# Patient Record
Sex: Female | Born: 1945
Health system: Southern US, Community
[De-identification: ages and names within clinical notes are randomized; demographics above are authoritative.]

## PROBLEM LIST (undated history)

## (undated) DIAGNOSIS — I10 Essential (primary) hypertension: Secondary | ICD-10-CM

## (undated) DIAGNOSIS — T7840XA Allergy, unspecified, initial encounter: Secondary | ICD-10-CM

## (undated) DIAGNOSIS — I82409 Acute embolism and thrombosis of unspecified deep veins of unspecified lower extremity: Secondary | ICD-10-CM

## (undated) HISTORY — DX: Acute embolism and thrombosis of unspecified deep veins of unspecified lower extremity: I82.409

## (undated) HISTORY — PX: BREAST CYST ASPIRATION: SHX578

## (undated) HISTORY — DX: Allergy, unspecified, initial encounter: T78.40XA

---

## 2001-01-19 ENCOUNTER — Encounter (INDEPENDENT_AMBULATORY_CARE_PROVIDER_SITE_OTHER): Payer: Self-pay | Admitting: Specialist

## 2001-01-19 ENCOUNTER — Ambulatory Visit (HOSPITAL_COMMUNITY): Admission: RE | Admit: 2001-01-19 | Discharge: 2001-01-19 | Payer: Self-pay | Admitting: Gastroenterology

## 2001-02-07 ENCOUNTER — Other Ambulatory Visit: Admission: RE | Admit: 2001-02-07 | Discharge: 2001-02-07 | Payer: Self-pay | Admitting: Obstetrics and Gynecology

## 2001-08-08 ENCOUNTER — Other Ambulatory Visit: Admission: RE | Admit: 2001-08-08 | Discharge: 2001-08-08 | Payer: Self-pay | Admitting: Obstetrics and Gynecology

## 2002-03-28 ENCOUNTER — Other Ambulatory Visit: Admission: RE | Admit: 2002-03-28 | Discharge: 2002-03-28 | Payer: Self-pay | Admitting: Family Medicine

## 2002-04-04 ENCOUNTER — Encounter: Admission: RE | Admit: 2002-04-04 | Discharge: 2002-04-04 | Payer: Self-pay | Admitting: Family Medicine

## 2002-04-04 ENCOUNTER — Encounter: Payer: Self-pay | Admitting: Family Medicine

## 2002-04-06 ENCOUNTER — Encounter: Admission: RE | Admit: 2002-04-06 | Discharge: 2002-04-06 | Payer: Self-pay | Admitting: Family Medicine

## 2002-04-06 ENCOUNTER — Encounter: Payer: Self-pay | Admitting: Family Medicine

## 2002-09-15 ENCOUNTER — Other Ambulatory Visit: Admission: RE | Admit: 2002-09-15 | Discharge: 2002-09-15 | Payer: Self-pay | Admitting: Obstetrics and Gynecology

## 2003-07-31 ENCOUNTER — Encounter: Admission: RE | Admit: 2003-07-31 | Discharge: 2003-07-31 | Payer: Self-pay | Admitting: Family Medicine

## 2003-10-02 ENCOUNTER — Other Ambulatory Visit: Admission: RE | Admit: 2003-10-02 | Discharge: 2003-10-02 | Payer: Self-pay | Admitting: Obstetrics and Gynecology

## 2004-08-04 ENCOUNTER — Encounter: Admission: RE | Admit: 2004-08-04 | Discharge: 2004-08-04 | Payer: Self-pay | Admitting: Family Medicine

## 2004-10-22 ENCOUNTER — Other Ambulatory Visit: Admission: RE | Admit: 2004-10-22 | Discharge: 2004-10-22 | Payer: Self-pay | Admitting: Obstetrics and Gynecology

## 2004-11-10 ENCOUNTER — Encounter: Admission: RE | Admit: 2004-11-10 | Discharge: 2004-11-10 | Payer: Self-pay | Admitting: Family Medicine

## 2005-10-30 ENCOUNTER — Other Ambulatory Visit: Admission: RE | Admit: 2005-10-30 | Discharge: 2005-10-30 | Payer: Self-pay | Admitting: Obstetrics and Gynecology

## 2005-11-11 ENCOUNTER — Encounter: Admission: RE | Admit: 2005-11-11 | Discharge: 2005-11-11 | Payer: Self-pay | Admitting: Family Medicine

## 2006-12-15 ENCOUNTER — Encounter: Admission: RE | Admit: 2006-12-15 | Discharge: 2006-12-15 | Payer: Self-pay | Admitting: Family Medicine

## 2007-12-16 ENCOUNTER — Encounter: Admission: RE | Admit: 2007-12-16 | Discharge: 2007-12-16 | Payer: Self-pay | Admitting: Family Medicine

## 2008-12-18 ENCOUNTER — Encounter: Admission: RE | Admit: 2008-12-18 | Discharge: 2008-12-18 | Payer: Self-pay | Admitting: Family Medicine

## 2009-03-19 ENCOUNTER — Encounter: Admission: RE | Admit: 2009-03-19 | Discharge: 2009-03-19 | Payer: Self-pay | Admitting: Family Medicine

## 2009-12-24 ENCOUNTER — Encounter: Admission: RE | Admit: 2009-12-24 | Discharge: 2009-12-24 | Payer: Self-pay | Admitting: Family Medicine

## 2010-02-25 ENCOUNTER — Encounter: Admission: RE | Admit: 2010-02-25 | Discharge: 2010-02-25 | Payer: Self-pay | Admitting: Family Medicine

## 2010-10-03 ENCOUNTER — Other Ambulatory Visit: Payer: Self-pay | Admitting: Gastroenterology

## 2010-10-03 NOTE — Procedures (Signed)
[place]  Patient:    Angie Bruce, Angie Bruce Visit Number: [id number] MRN: [id number]          Service Type: END Location: ENDO Attending Physician:  Nelda Marseille Admit Date:  01/19/2001   CC:         Geraldo Pitter, M.D.   Procedure Report  NAME OF PROCEDURE:  Colonoscopy with biopsy.  ENDOSCOPIST:  Petra Kuba, M.D.  INDICATIONS:  Family history of colon cancer.  CONSENT:  Consent was signed after risks, benefits, methods and options were thoroughly discussed in the office.  MEDICINES USED:  Demerol 50 and Versed 5.  PROCEDURE:  Rectal inspection was pertinent for small external hemorrhoids. Digital exam was negative.  The pediatric video colonoscope was inserted, easily advanced around the colon to the cecum.  This did not require any abdominal pressure or any position changes.  Cecum was identified by the appendiceal orifice and the ileocecal valve.  Vectorscope was inserted shortways into the terminal ileum, which was normal.  Photo documentation was documentation was obtained.  No obvious abnormality was seen on insertion.  The scope was slowly withdrawn.  Prep was adequate.  There was some liquid stool that required washing and suctioning.  On slow withdrawal through the colon the cecum, ascending and transverse were normal.  As the scope was withdrawn around the left side of the colon three questionable tiny descending and sigmoid polyps were seen, and were all cold biopsied times one or two, and put in the same container.  No other abnormalities were seen as we slowly withdrew back to the rectum.  Once back in the rectum the scope was retroflexed, pertinent for some internal hemorrhoids.  The scope was straightened, readvanced shortways up the cecum.  Air was suctioned. Scope removed.  Patient tolerated the procedure well.  There was no obvious immediate complication.  ENDOSCOPIC DIAGNOSES: 1. Internal and  external hemorrhoids. 2. Questionable three tiny left-sided polyps, status post cold biopsy. 3. Otherwise within normal limits to the terminal ileum.  PLAN:  Await pathology to determine future colonic screening.  GI follow up p.r.n., otherwise return care to Dr. Parke Simmers for the customary yearly rectals and guaiacs.  We will go ahead and give her some _________ with 2.5% hydrocortisone for her hemorrhoids; that seems to be helping. Attending Physician:  Nelda Marseille DD:  01/19/01 TD:  01/19/01 Job: [phone] [id number]

## 2010-11-17 ENCOUNTER — Other Ambulatory Visit: Payer: Self-pay | Admitting: Family Medicine

## 2010-11-17 DIAGNOSIS — Z1231 Encounter for screening mammogram for malignant neoplasm of breast: Secondary | ICD-10-CM

## 2010-12-30 ENCOUNTER — Ambulatory Visit
Admission: RE | Admit: 2010-12-30 | Discharge: 2010-12-30 | Disposition: A | Discharge status: Home / Self Care | Payer: Federal, State, Local not specified - PPO | Source: Ambulatory Visit | Attending: Family Medicine | Admitting: Family Medicine

## 2010-12-30 DIAGNOSIS — Z1231 Encounter for screening mammogram for malignant neoplasm of breast: Secondary | ICD-10-CM

## 2011-04-01 ENCOUNTER — Other Ambulatory Visit: Payer: Self-pay | Admitting: Family Medicine

## 2011-04-01 ENCOUNTER — Other Ambulatory Visit (HOSPITAL_COMMUNITY)
Admission: RE | Admit: 2011-04-01 | Discharge: 2011-04-01 | Disposition: A | Discharge status: Home / Self Care | Payer: Federal, State, Local not specified - PPO | Source: Ambulatory Visit | Attending: Family Medicine | Admitting: Family Medicine

## 2011-04-01 DIAGNOSIS — Z01419 Encounter for gynecological examination (general) (routine) without abnormal findings: Secondary | ICD-10-CM | POA: Insufficient documentation

## 2011-04-01 DIAGNOSIS — Z1159 Encounter for screening for other viral diseases: Secondary | ICD-10-CM | POA: Insufficient documentation

## 2011-09-20 ENCOUNTER — Emergency Department (HOSPITAL_COMMUNITY): Payer: Federal, State, Local not specified - PPO

## 2011-09-20 ENCOUNTER — Encounter (HOSPITAL_COMMUNITY): Payer: Self-pay

## 2011-09-20 ENCOUNTER — Observation Stay (HOSPITAL_COMMUNITY)
Admission: EM | Admit: 2011-09-20 | Discharge: 2011-09-21 | Disposition: A | Discharge status: Home / Self Care | Payer: Federal, State, Local not specified - PPO | Attending: Internal Medicine | Admitting: Internal Medicine

## 2011-09-20 DIAGNOSIS — R079 Chest pain, unspecified: Principal | ICD-10-CM | POA: Insufficient documentation

## 2011-09-20 DIAGNOSIS — J45909 Unspecified asthma, uncomplicated: Secondary | ICD-10-CM | POA: Insufficient documentation

## 2011-09-20 DIAGNOSIS — I10 Essential (primary) hypertension: Secondary | ICD-10-CM

## 2011-09-20 DIAGNOSIS — M810 Age-related osteoporosis without current pathological fracture: Secondary | ICD-10-CM | POA: Insufficient documentation

## 2011-09-20 DIAGNOSIS — I517 Cardiomegaly: Secondary | ICD-10-CM | POA: Insufficient documentation

## 2011-09-20 DIAGNOSIS — T7840XA Allergy, unspecified, initial encounter: Secondary | ICD-10-CM | POA: Insufficient documentation

## 2011-09-20 DIAGNOSIS — X58XXXA Exposure to other specified factors, initial encounter: Secondary | ICD-10-CM | POA: Insufficient documentation

## 2011-09-20 HISTORY — DX: Essential (primary) hypertension: I10

## 2011-09-20 LAB — COMPREHENSIVE METABOLIC PANEL
Alkaline Phosphatase: 30 U/L — ABNORMAL LOW (ref 39–117)
CO2: 25 mEq/L (ref 19–32)
Creatinine, Ser: 0.56 mg/dL (ref 0.50–1.10)
Glucose, Bld: 91 mg/dL (ref 70–99)
Potassium: 3.5 mEq/L (ref 3.5–5.1)

## 2011-09-20 LAB — APTT: aPTT: 32 seconds (ref 24–37)

## 2011-09-20 LAB — CBC
MCH: 30.5 pg (ref 26.0–34.0)
MCHC: 34 g/dL (ref 30.0–36.0)
MCV: 89.7 fL (ref 78.0–100.0)
RDW: 12.9 % (ref 11.5–15.5)
WBC: 7.5 10*3/uL (ref 4.0–10.5)

## 2011-09-20 LAB — POCT I-STAT TROPONIN I: Troponin i, poc: 0 ng/mL (ref 0.00–0.08)

## 2011-09-20 LAB — CARDIAC PANEL(CRET KIN+CKTOT+MB+TROPI)
Relative Index: 1.6 (ref 0.0–2.5)
Relative Index: 1.6 (ref 0.0–2.5)
Total CK: 164 U/L (ref 7–177)
Troponin I: <0.3 ng/mL (ref <0.30)

## 2011-09-20 LAB — PROTIME-INR
INR: 0.9 (ref 0.00–1.49)
Prothrombin Time: 12.3 seconds (ref 11.6–15.2)

## 2011-09-20 MED ORDER — ACETAMINOPHEN 325 MG PO TABS: 650.0000 mg | ORAL_TABLET | Freq: Four times a day (QID) | ORAL | Status: DC | PRN

## 2011-09-20 MED ORDER — MORPHINE SULFATE 2 MG/ML IJ SOLN: 1.0000 mg | INTRAMUSCULAR | Status: DC | PRN

## 2011-09-20 MED ORDER — OMEGA-3-ACID ETHYL ESTERS 1 G PO CAPS
1.0000 g | ORAL_CAPSULE | Freq: Every day | ORAL | Status: DC
  Administered 2011-09-21: 1 g via ORAL
  Filled 2011-09-20: qty 1

## 2011-09-20 MED ORDER — NITROGLYCERIN 0.4 MG SL SUBL
0.4000 mg | SUBLINGUAL_TABLET | SUBLINGUAL | Status: DC | PRN
  Filled 2011-09-20: qty 25

## 2011-09-20 MED ORDER — ACETAMINOPHEN 650 MG RE SUPP: 650.0000 mg | Freq: Four times a day (QID) | RECTAL | Status: DC | PRN

## 2011-09-20 MED ORDER — MONTELUKAST SODIUM 10 MG PO TABS
10.0000 mg | ORAL_TABLET | Freq: Every day | ORAL | Status: DC
  Administered 2011-09-20: 10 mg via ORAL
  Filled 2011-09-20 (×2): qty 1

## 2011-09-20 MED ORDER — B COMPLEX-C PO TABS
1.0000 | ORAL_TABLET | Freq: Every day | ORAL | Status: DC
  Administered 2011-09-21: 1 via ORAL
  Filled 2011-09-20: qty 1

## 2011-09-20 MED ORDER — ENOXAPARIN SODIUM 40 MG/0.4ML ~~LOC~~ SOLN
40.0000 mg | SUBCUTANEOUS | Status: DC
  Administered 2011-09-20: 40 mg via SUBCUTANEOUS
  Filled 2011-09-20 (×2): qty 0.4

## 2011-09-20 MED ORDER — SODIUM CHLORIDE 0.9 % IJ SOLN
3.0000 mL | Freq: Two times a day (BID) | INTRAMUSCULAR | Status: DC
  Administered 2011-09-20 – 2011-09-21 (×2): 3 mL via INTRAVENOUS

## 2011-09-20 MED ORDER — SODIUM CHLORIDE 0.9 % IV SOLN
1000.0000 mL | INTRAVENOUS | Status: DC
  Administered 2011-09-20: 1000 mL via INTRAVENOUS

## 2011-09-20 MED ORDER — ADULT MULTIVITAMIN W/MINERALS CH
1.0000 | ORAL_TABLET | Freq: Every day | ORAL | Status: DC
  Administered 2011-09-21: 1 via ORAL
  Filled 2011-09-20: qty 1

## 2011-09-20 MED ORDER — NITROGLYCERIN 2 % TD OINT
1.0000 [in_us] | TOPICAL_OINTMENT | Freq: Four times a day (QID) | TRANSDERMAL | Status: DC
  Administered 2011-09-20 (×2): 1 [in_us] via TOPICAL
  Filled 2011-09-20 (×2): qty 30

## 2011-09-20 MED ORDER — ASPIRIN 325 MG PO TABS
325.0000 mg | ORAL_TABLET | Freq: Every day | ORAL | Status: DC
  Administered 2011-09-21: 325 mg via ORAL
  Filled 2011-09-20: qty 1

## 2011-09-20 MED ORDER — CETIRIZINE HCL 10 MG PO TABS
5.0000 mg | ORAL_TABLET | Freq: Every evening | ORAL | Status: DC
  Filled 2011-09-20: qty 1

## 2011-09-20 MED ORDER — TRIAMTERENE-HCTZ 37.5-25 MG PO TABS
1.0000 | ORAL_TABLET | Freq: Every day | ORAL | Status: DC
  Administered 2011-09-21: 1 via ORAL
  Filled 2011-09-20: qty 1

## 2011-09-20 NOTE — ED Provider Notes (Addendum)
History     CSN: [id number] Arrival date & time 09/20/11  1205 First MD Initiated Contact with Patient 09/20/11 1211     Chief Complaint  Patient presents with  . Chest Pain    HPI The patient presents to the emergency room with complaints of chest pain. The patient was at church, singing in the choir, when she developed a sudden onset of cramping moderate to severe chest pain in the center of her chest. The pain did not radiate but she started to feel some nausea associated with it. Patient denies any shortness of breath or diaphoresis. She stepped away from the choir went to the back and thought if she loosened her bra that might improve her symptoms. The symptoms persisted and she asked her husband to bring her to the emergency room. Patient has never had episodes similar to this in the past. She does not have history of heart disease or pulmonary embolism. She took an aspirin on the way and feels like her symptoms have improved and resolved. She has a history of hypertension but no known history of heart disease. There is no history of heart disease in the family. Past Medical History  Diagnosis Date  . Hypertension     No past surgical history on file.  No family history on file.: No heart disease  History  Substance Use Topics  . Smoking status: Never Smoker   . Smokeless tobacco: Not on file  . Alcohol Use: No    OB History    Grav Para Term Preterm Abortions TAB SAB Ect Mult Living                  Review of Systems  All other systems reviewed and are negative.    Allergies  Sulfa antibiotics  Home Medications  No current outpatient prescriptions on file.  BP 131/71  Pulse 64  Temp(Src) 97.9 F (36.6 C) (Oral)  Resp 12  SpO2 100%  Physical Exam  Nursing note and vitals reviewed. Constitutional: She appears well-developed and well-nourished. No distress.  HENT:  Head: Normocephalic and atraumatic.  Right Ear: External ear normal.  Left Ear: External ear  normal.  Eyes: Conjunctivae are normal. Right eye exhibits no discharge. Left eye exhibits no discharge. No scleral icterus.  Neck: Neck supple. No tracheal deviation present.  Cardiovascular: Normal rate, regular rhythm and intact distal pulses.   Pulmonary/Chest: Effort normal and breath sounds normal. No stridor. No respiratory distress. She has no wheezes. She has no rales.  Abdominal: Soft. Bowel sounds are normal. She exhibits no distension. There is no tenderness. There is no rebound and no guarding.  Musculoskeletal: She exhibits no edema and no tenderness.  Neurological: She is alert. She has normal strength. No sensory deficit. Cranial nerve deficit:  no gross defecits noted. She exhibits normal muscle tone. She displays no seizure activity. Coordination normal.  Skin: Skin is warm and dry. No rash noted.  Psychiatric: She has a normal mood and affect.    ED Course  Procedures (including critical care time)  Rate: 63  Rhythm: normal sinus rhythm  QRS Axis: normal  Intervals: normal  ST/T Wave abnormalities: normal  Conduction Disutrbances:none  Narrative Interpretation: right atrial abnormality  Old EKG Reviewed: none available  Labs Reviewed  COMPREHENSIVE METABOLIC PANEL - Abnormal; Notable for the following:    Alkaline Phosphatase 30 (*)    All other components within normal limits  CBC  PROTIME-INR  APTT  POCT I-STAT TROPONIN I  Dg Chest Portable 1 View  09/20/2011  *RADIOLOGY REPORT*  Clinical Data: Chest pain.  High blood pressure.  PORTABLE CHEST - 1 VIEW  Comparison: 08/05/2006.  Findings: Pulmonary vascular congestion.  Cardiomegaly.  No segmental consolidation or pneumothorax.  Mildly tortuous aorta.  The patient would eventually benefit from follow-up two-view chest with cardiac leads removed.  IMPRESSION: Pulmonary vascular congestion.  Cardiomegaly.  Mildly tortuous aorta.  Please see above.  Original Report Authenticated By: Fuller Canada, M.D.       MDM  Pt pain free now however pt's symptom's concerning for ACS.  Discussed treatment with patient.  Will hold overnight for observation and further cardiac eval.    Celene Kras, MD 09/20/11 [phone]

## 2011-09-20 NOTE — H&P (Signed)
Patient's PCP: Geraldo Pitter, MD, MD  Chief Complaint: Chest pain  History of Present Illness: Angie Bruce is a 66 y.o. African American female with history of hypertension, allergies with reactive airway disease on inhalers who presents with the above complaints.  Patient reports she was at church, on the choir this morning and developed chest pain.  She felt it was a pressure-like sensation she was nauseated but did not vomit.  Initially she did attempted to loosen her bra strap in the hopes that chest pain would improve but had persistent chest pain.  She felt like she could not continue on the choir and as a result requested her husband bring the patient to the emergency department.  Chest pain started from around 1130 a.m. to 12 p.m.  Husband stopped at a gas station and brought aspirin for the patient, she took 2 tablets of 325 mg of aspirin.  Since then chest pain has resolved.  Patient denies any jaw pain, any radiating pain to the shoulders, or her arm.  Denies having diaphoresis.  Denies having any such symptoms in the past.  Denies any recent fevers, chills, shortness of breath, abdominal pain.  Did note to have one episode of diarrhea 5 days ago which has resolved.  Does complain of pressure-like headache while on the nitroglycerin patch.  Denies any vision changes.  Patient does complain of shortness of breath when she goes up a flight of steps but is not unusual for her.  Past Medical History  Diagnosis Date  . Hypertension    History reviewed. No pertinent past surgical history. Family History  Problem Relation Age of Onset  . Stomach cancer Mother   . Stroke Father    History   Social History  . Marital Status: Married    Spouse Name: N/A    Number of Children: N/A  . Years of Education: N/A   Occupational History  . Not on file.   Social History Main Topics  . Smoking status: Never Smoker   . Smokeless tobacco: Not on file  . Alcohol Use: No  . Drug Use: No  .  Sexually Active:    Other Topics Concern  . Not on file   Social History Narrative  . No narrative on file   Family history: No significant family history for heart problems. Social history: Drinks alcohol once every 6 months if that.  Allergies: Sulfa antibiotics  Meds: Scheduled Meds:   . aspirin  325 mg Oral Daily  . nitroGLYCERIN  1 inch Topical Q6H   Continuous Infusions:   . sodium chloride 1,000 mL (09/20/11 1249)   PRN Meds:.nitroGLYCERIN  Review of Systems: All systems reviewed with the patient and positive as per history of present illness, otherwise all other systems are negative.  Physical Exam: Blood pressure 122/58, pulse 78, temperature 97.9 F (36.6 C), temperature source Oral, resp. rate 12, SpO2 100.00%. General: Awake, Oriented x3, No acute distress. HEENT: EOMI, Moist mucous membranes Neck: Supple CV: S1 and S2 Lungs: Clear to ascultation bilaterally Abdomen: Soft, Nontender, Nondistended, +bowel sounds. Ext: Good pulses. Trace edema. No clubbing or cyanosis noted. Neuro: Cranial Nerves II-XII grossly intact. Has 5/5 motor strength in upper and lower extremities.  Lab results:  [facility] 09/20/11 1305  NA 138  K 3.5  CL 102  CO2 25  GLUCOSE 91  BUN 15  CREATININE 0.56  CALCIUM 9.1  MG --  PHOS --    Basename 09/20/11 1305  AST 24  ALT  16  ALKPHOS 30*  BILITOT 0.3  PROT 7.0  ALBUMIN 3.9   No results found for this basename: LIPASE:2,AMYLASE:2 in the last 72 hours  Basename 09/20/11 1305  WBC 7.5  NEUTROABS --  HGB 13.0  HCT 38.2  MCV 89.7  PLT 159    Basename 09/20/11 1612  CKTOTAL 164  CKMB 2.7  CKMBINDEX --  TROPONINI <0.30   No components found with this basename: POCBNP:3 No results found for this basename: DDIMER in the last 72 hours No results found for this basename: HGBA1C:2 in the last 72 hours No results found for this basename: CHOL:2,HDL:2,LDLCALC:2,TRIG:2,CHOLHDL:2,LDLDIRECT:2 in the last 72 hours No  results found for this basename: TSH,T4TOTAL,FREET3,T3FREE,THYROIDAB in the last 72 hours No results found for this basename: VITAMINB12:2,FOLATE:2,FERRITIN:2,TIBC:2,IRON:2,RETICCTPCT:2 in the last 72 hours Imaging results:  Dg Chest Portable 1 View  09/20/2011  *RADIOLOGY REPORT*  Clinical Data: Chest pain.  High blood pressure.  PORTABLE CHEST - 1 VIEW  Comparison: 08/05/2006.  Findings: Pulmonary vascular congestion.  Cardiomegaly.  No segmental consolidation or pneumothorax.  Mildly tortuous aorta.  The patient would eventually benefit from follow-up two-view chest with cardiac leads removed.  IMPRESSION: Pulmonary vascular congestion.  Cardiomegaly.  Mildly tortuous aorta.  Please see above.  Original Report Authenticated By: Fuller Canada, M.D.   Other results: EKG: normal EKG, normal sinus rhythm, there are no previous tracings available for comparison.  Assessment & Plan by Problem: Chest pain, atypical Etiology unclear.  Admit the patient to rule out for acute coronary syndrome.  EKG showed normal sinus rhythm, no prior EKGs for comparison.  Will get a 2-D echocardiogram given chest x-ray shows cardiomegaly.  Continue aspirin.  Initial troponin negative.  Patient ruled out for acute coronary syndrome and if that echocardiogram is normal likely will need outpatient stress test.  If echocardiogram is abnormal and has elevated troponin likely will need cardiology consultation.  Hypertension Stable.  Continue home medication of triamterene/hydrochlorothiazide.  Cardiomegaly 2-D echocardiogram ordered as indicated above.  Chest x-ray is portable, may be technique related that could be causing cardiomegaly.  History of allergies with reactive airway disease Continue home inhalers.  Stable.  Not having any respiratory symptoms at this time.  Osteoporosis Hold risedronate as inpatient.  Prophylaxis Lovenox.  CODE STATUS Full code.  Time spent on admission, talking to the patient, and  coordinating care was: 45 mins.  Tanara Turvey A, MD 09/20/2011, 4:55 PM

## 2011-09-20 NOTE — ED Notes (Addendum)
Dr. Lynelle Doctor in room, aware of the PVC's that patient is having

## 2011-09-20 NOTE — ED Notes (Signed)
Pt c/o sudden onset of chest pain at church describe as cramping, husband gave 2 pills of ASA (dose unknown), and the symptoms went away. Pt now pain free, no sob, nausea, nor vomiting. Breath sound clear.

## 2011-09-21 DIAGNOSIS — I10 Essential (primary) hypertension: Secondary | ICD-10-CM

## 2011-09-21 DIAGNOSIS — R079 Chest pain, unspecified: Secondary | ICD-10-CM

## 2011-09-21 LAB — CARDIAC PANEL(CRET KIN+CKTOT+MB+TROPI)
CK, MB: 2.1 ng/mL (ref 0.3–4.0)
Relative Index: 1.7 (ref 0.0–2.5)
Troponin I: <0.3 ng/mL (ref <0.30)

## 2011-09-21 NOTE — Discharge Summary (Signed)
Discharge Summary  Angie Bruce MR#: [id number]  DOB:Jun 16, 1945  Date of Admission: 09/20/2011 Date of Discharge: 09/21/2011  Patient's PCP: Geraldo Pitter, MD, MD  Attending Physician:Domenic Schoenberger A  Consults: Dr. Eldridge Dace, Cardiology, over the telephone who read the echocardiogram.  Discharge Diagnoses: Principal Problem:  *Chest pain Active Problems:  Hypertension  Cardiomegaly  Brief Admitting History and Physical Angie Bruce is a 66 y.o. African American female with history of hypertension, allergies with reactive airway disease on inhalers who presented on 09/20/2011 chest pain.  Discharge Medications Medication List  As of 09/21/2011  3:10 PM   TAKE these medications         aspirin 81 MG tablet   Take 81 mg by mouth daily.      B-complex with vitamin C tablet   Take 1 tablet by mouth daily.      fish oil-omega-3 fatty acids 1000 MG capsule   Take 2 g by mouth daily.      levocetirizine 5 MG tablet   Commonly known as: XYZAL   Take 5 mg by mouth every evening.      montelukast 10 MG tablet   Commonly known as: SINGULAIR   Take 10 mg by mouth at bedtime.      mulitivitamin with minerals Tabs   Take 1 tablet by mouth daily.      risedronate 35 MG tablet   Commonly known as: ACTONEL   Take 35 mg by mouth every 7 (seven) days. with water on empty stomach, nothing by mouth or lie down for next 30 minutes.      triamterene-hydrochlorothiazide 37.5-25 MG per tablet   Commonly known as: MAXZIDE-25   Take 1 tablet by mouth daily.            Hospital Course: Chest pain, atypical  Etiology unclear, no further episodes. Patient ruled out for acute coronary syndrome with negative troponins x3. No events noted on telemetry except PACs/PVCs. 2-D echocardiogram performed on 09/21/2011 showed cavity size was normal, systolic function was vigorous, ejection fraction was 65-70%, wall motion was normal, there were no regional wall motion abnormalities.  Discussed  the echocardiogram findings with Dr. Eldridge Dace, [facility], who will arrange for outpatient stress test.  Patient was instructed to continue aspirin.  If any further episodes of chest pain she was instructed to come back to the emergency department or contact her primary care physician for further evaluation.   Hypertension  Stable. Continue home triamterene/hydrochlorothiazide at discharge.   Cardiomegaly  Likely artifactual from portable chest x-ray positioning.  2-D echocardiogram done with results as indicated below.   History of allergies with reactive airway disease  Continue home inhalers. Stable. Not having any respiratory symptoms at this time.   Osteoporosis  Hold risedronate as inpatient.   Day of Discharge BP 113/68  Pulse 74  Temp(Src) 98.7 F (37.1 C) (Oral)  Resp 16  Ht 5\' 6"  (1.676 m)  Wt 79.9 kg (176 lb 2.4 oz)  BMI 28.43 kg/m2  SpO2 99%  Results for orders placed during the hospital encounter of 09/20/11 (from the past 48 hour(s))  POCT I-STAT TROPONIN I     Status: Normal   Collection Time   09/20/11 12:56 PM      Component Value Range Comment   Troponin i, poc 0.00  0.00 - 0.08 (ng/mL)    Comment 3            CBC     Status: Normal   Collection Time  09/20/11  1:05 PM      Component Value Range Comment   WBC 7.5  4.0 - 10.5 (K/uL)    RBC 4.26  3.87 - 5.11 (MIL/uL)    Hemoglobin 13.0  12.0 - 15.0 (g/dL)    HCT 16.1  09.6 - 04.5 (%)    MCV 89.7  78.0 - 100.0 (fL)    MCH 30.5  26.0 - 34.0 (pg)    MCHC 34.0  30.0 - 36.0 (g/dL)    RDW 40.9  81.1 - 91.4 (%)    Platelets 159  150 - 400 (K/uL)   COMPREHENSIVE METABOLIC PANEL     Status: Abnormal   Collection Time   09/20/11  1:05 PM      Component Value Range Comment   Sodium 138  135 - 145 (mEq/L)    Potassium 3.5  3.5 - 5.1 (mEq/L)    Chloride 102  96 - 112 (mEq/L)    CO2 25  19 - 32 (mEq/L)    Glucose, Bld 91  70 - 99 (mg/dL)    BUN 15  6 - 23 (mg/dL)    Creatinine, Ser 7.82  0.50 - 1.10 (mg/dL)      Calcium 9.1  8.4 - 10.5 (mg/dL)    Total Protein 7.0  6.0 - 8.3 (g/dL)    Albumin 3.9  3.5 - 5.2 (g/dL)    AST 24  0 - 37 (U/L)    ALT 16  0 - 35 (U/L)    Alkaline Phosphatase 30 (*) 39 - 117 (U/L)    Total Bilirubin 0.3  0.3 - 1.2 (mg/dL)    GFR calc non Af Amer >90  >90 (mL/min)    GFR calc Af Amer >90  >90 (mL/min)   PROTIME-INR     Status: Normal   Collection Time   09/20/11  1:05 PM      Component Value Range Comment   Prothrombin Time 12.3  11.6 - 15.2 (seconds)    INR 0.90  0.00 - 1.49    APTT     Status: Normal   Collection Time   09/20/11  1:05 PM      Component Value Range Comment   aPTT 32  24 - 37 (seconds)   CARDIAC PANEL(CRET KIN+CKTOT+MB+TROPI)     Status: Normal   Collection Time   09/20/11  4:12 PM      Component Value Range Comment   Total CK 164  7 - 177 (U/L)    CK, MB 2.7  0.3 - 4.0 (ng/mL)    Troponin I <0.30  <0.30 (ng/mL)    Relative Index 1.6  0.0 - 2.5    POCT I-STAT TROPONIN I     Status: Normal   Collection Time   09/20/11  4:37 PM      Component Value Range Comment   Troponin i, poc 0.01  0.00 - 0.08 (ng/mL)    Comment 3            CARDIAC PANEL(CRET KIN+CKTOT+MB+TROPI)     Status: Normal   Collection Time   09/20/11  9:20 PM      Component Value Range Comment   Total CK 137  7 - 177 (U/L)    CK, MB 2.2  0.3 - 4.0 (ng/mL)    Troponin I <0.30  <0.30 (ng/mL)    Relative Index 1.6  0.0 - 2.5    CARDIAC PANEL(CRET KIN+CKTOT+MB+TROPI)     Status: Normal   Collection Time  09/21/11  2:52 AM      Component Value Range Comment   Total CK 127  7 - 177 (U/L)    CK, MB 2.1  0.3 - 4.0 (ng/mL)    Troponin I <0.30  <0.30 (ng/mL)    Relative Index 1.7  0.0 - 2.5      Dg Chest Portable 1 View  09/20/2011  *RADIOLOGY REPORT*  Clinical Data: Chest pain.  High blood pressure.  PORTABLE CHEST - 1 VIEW  Comparison: 08/05/2006.  Findings: Pulmonary vascular congestion.  Cardiomegaly.  No segmental consolidation or pneumothorax.  Mildly tortuous aorta.  The patient  would eventually benefit from follow-up two-view chest with cardiac leads removed.  IMPRESSION: Pulmonary vascular congestion.  Cardiomegaly.  Mildly tortuous aorta.  Please see above.  Original Report Authenticated By: Fuller Canada, M.D.   2-D echocardiogram on 09/21/2011 Study Conclusions Left ventricle: The cavity size was normal. Systolic function was vigorous. The estimated ejection fraction was in the range of 65% to 70%. Wall motion was normal; there were no regional wall motion abnormalities.  Disposition: Home with outpatient followup for stress test.  Diet: Heart healthy diet.  Activity: Resume as tolerated  Follow-up Appts: Discharge Orders    Future Orders Please Complete By Expires   Diet - low sodium heart healthy      Increase activity slowly      Discharge instructions      Comments:   Please followup with your PCP in 1 week. [facility] will arrange for outpatient stress test if they have not called you in 1-2 days please call [phone] to arrange for outpatient followup.      TESTS THAT NEED FOLLOW-UP None  Time spent on discharge, talking to the patient, and coordinating care: 25 mins.   Signed: Cristal Ford, MD 09/21/2011, 3:10 PM

## 2011-09-21 NOTE — Progress Notes (Signed)
*  PRELIMINARY RESULTS* Echocardiogram 2D Echocardiogram has been performed.  Angie Bruce 09/21/2011, 9:46 AM

## 2011-09-21 NOTE — Progress Notes (Signed)
Subjective: Denies any chest pain or shortness of breath.  No other specific complaints.  Objective: Vital signs in last 24 hours: Filed Vitals:   09/20/11 1557 09/20/11 1842 09/20/11 2111 09/21/11 0548  BP: 122/58 106/68 101/59 123/75  Pulse: 78 71 78 68  Temp: 97.9 F (36.6 C) 97.8 F (36.6 C) 98 F (36.7 C) 98.4 F (36.9 C)  TempSrc: Oral Oral Oral Oral  Resp: 12 16 18 16   Height:  5\' 6"  (1.676 m)    Weight:  79.9 kg (176 lb 2.4 oz)    SpO2: 100% 98% 100% 99%   Weight change:   Intake/Output Summary (Last 24 hours) at 09/21/11 1131 Last data filed at 09/21/11 1000  Gross per 24 hour  Intake    363 ml  Output   1650 ml  Net  -1287 ml    Physical Exam: General: Awake, Oriented, No acute distress. HEENT: EOMI. Neck: Supple CV: S1 and S2 Lungs: Clear to ascultation bilaterally Abdomen: Soft, Nontender, Nondistended, +bowel sounds. Ext: Good pulses. Trace edema.  Lab Results:  [facility] 09/20/11 1305  NA 138  K 3.5  CL 102  CO2 25  GLUCOSE 91  BUN 15  CREATININE 0.56  CALCIUM 9.1  MG --  PHOS --    Basename 09/20/11 1305  AST 24  ALT 16  ALKPHOS 30*  BILITOT 0.3  PROT 7.0  ALBUMIN 3.9   No results found for this basename: LIPASE:2,AMYLASE:2 in the last 72 hours  Basename 09/20/11 1305  WBC 7.5  NEUTROABS --  HGB 13.0  HCT 38.2  MCV 89.7  PLT 159    Basename 09/21/11 0252 09/20/11 2120 09/20/11 1612  CKTOTAL 127 137 164  CKMB 2.1 2.2 2.7  CKMBINDEX -- -- --  TROPONINI <0.30 <0.30 <0.30   No components found with this basename: POCBNP:3 No results found for this basename: DDIMER:2 in the last 72 hours No results found for this basename: HGBA1C:2 in the last 72 hours No results found for this basename: CHOL:2,HDL:2,LDLCALC:2,TRIG:2,CHOLHDL:2,LDLDIRECT:2 in the last 72 hours No results found for this basename: TSH,T4TOTAL,FREET3,T3FREE,THYROIDAB in the last 72 hours No results found for this basename:  VITAMINB12:2,FOLATE:2,FERRITIN:2,TIBC:2,IRON:2,RETICCTPCT:2 in the last 72 hours  Micro Results: No results found for this or any previous visit (from the past 240 hour(s)).  Studies/Results: Dg Chest Portable 1 View  09/20/2011  *RADIOLOGY REPORT*  Clinical Data: Chest pain.  High blood pressure.  PORTABLE CHEST - 1 VIEW  Comparison: 08/05/2006.  Findings: Pulmonary vascular congestion.  Cardiomegaly.  No segmental consolidation or pneumothorax.  Mildly tortuous aorta.  The patient would eventually benefit from follow-up two-view chest with cardiac leads removed.  IMPRESSION: Pulmonary vascular congestion.  Cardiomegaly.  Mildly tortuous aorta.  Please see above.  Original Report Authenticated By: Fuller Canada, M.D.    Medications: I have reviewed the patient's current medications. Scheduled Meds:   . aspirin  325 mg Oral Daily  . B-complex with vitamin C  1 tablet Oral Daily  . cetirizine  5 mg Oral QPM  . enoxaparin  40 mg Subcutaneous Q24H  . montelukast  10 mg Oral QHS  . mulitivitamin with minerals  1 tablet Oral Daily  . omega-3 acid ethyl esters  1 g Oral Daily  . sodium chloride  3 mL Intravenous Q12H  . triamterene-hydrochlorothiazide  1 each Oral Daily  . DISCONTD: nitroGLYCERIN  1 inch Topical Q6H   Continuous Infusions:   . sodium chloride 1,000 mL (09/20/11 1249)   PRN Meds:.acetaminophen,  acetaminophen, morphine injection, nitroGLYCERIN  Assessment/Plan: Chest pain, atypical  Etiology unclear, no further episodes.  Patient ruled out for acute coronary syndrome with negative troponins x3.  No events noted on telemetry except PACs/PVCs.  2-D echocardiogram for cardiomegaly and chest pain pending.  Continue aspirin.  If the echocardiogram is normal, will need outpatient stress test. If echocardiogram is abnormal will need cardiology consultation.   Hypertension  Stable. Continue home medication of triamterene/hydrochlorothiazide.   Cardiomegaly  2-D echocardiogram  pending.   History of allergies with reactive airway disease  Continue home inhalers. Stable. Not having any respiratory symptoms at this time.   Osteoporosis  Hold risedronate as inpatient.   Prophylaxis  Lovenox.   CODE STATUS  Full code.  Disposition Pending on echocardiogram results.   LOS: 1 day  Chamaine Stankus A, MD 09/21/2011, 11:31 AM

## 2011-09-21 NOTE — Discharge Instructions (Signed)
Chest Pain (Nonspecific) Chest pain has many causes. Your pain could be caused by something serious, such as a heart attack or a blood clot in the lungs. It could also be caused by something less serious, such as a chest bruise or a virus. Follow up with your doctor. More lab tests or other studies may be needed to find the cause of your pain. Most of the time, nonspecific chest pain will improve within 2 to 3 days of rest and mild pain medicine. HOME CARE  For chest bruises, you may put ice on the sore area for 15 to 20 minutes, 3 to 4 times a day. Do this only if it makes you or your child feel better.   Put ice in a plastic bag.   Place a towel between the skin and the bag.   Rest for the next 2 to 3 days.   Go back to work if the pain improves.   See your doctor if the pain lasts longer than 1 to 2 weeks.   Only take medicine as told by your doctor.   Quit smoking if you smoke.  GET HELP RIGHT AWAY IF:   There is more pain or pain that spreads to the arm, neck, jaw, back, or belly (abdomen).   You or your child has shortness of breath.   You or your child coughs more than usual or coughs up blood.   You or your child has very bad back or belly pain, feels sick to his or her stomach (nauseous), or throws up (vomits).   You or your child has very bad weakness.   You or your child passes out (faints).   You or your child has a temperature by mouth above 102 F (38.9 C), not controlled by medicine.  Any of these problems may be serious and may be an emergency. Do not wait to see if the problems will go away. Get medical help right away. Call your local emergency services 911 in [place] Do not drive yourself to the hospital. MAKE SURE YOU:   Understand these instructions.   Will watch this condition.   Will get help right away if you or your child is not doing well or gets worse.  Document Released: 10/21/2007 Document Revised: 04/23/2011 Document Reviewed:  10/21/2007 [employer] Patient Information 2012 [employer]. 

## 2011-12-02 ENCOUNTER — Other Ambulatory Visit: Payer: Self-pay | Admitting: Family Medicine

## 2011-12-02 DIAGNOSIS — Z1231 Encounter for screening mammogram for malignant neoplasm of breast: Secondary | ICD-10-CM

## 2011-12-31 ENCOUNTER — Ambulatory Visit
Admission: RE | Admit: 2011-12-31 | Discharge: 2011-12-31 | Disposition: A | Discharge status: Home / Self Care | Payer: Federal, State, Local not specified - PPO | Source: Ambulatory Visit | Attending: Family Medicine | Admitting: Family Medicine

## 2011-12-31 DIAGNOSIS — Z1231 Encounter for screening mammogram for malignant neoplasm of breast: Secondary | ICD-10-CM

## 2012-04-01 ENCOUNTER — Other Ambulatory Visit: Payer: Self-pay | Admitting: Family Medicine

## 2012-04-01 ENCOUNTER — Other Ambulatory Visit (HOSPITAL_COMMUNITY)
Admission: RE | Admit: 2012-04-01 | Discharge: 2012-04-01 | Disposition: A | Discharge status: Home / Self Care | Payer: Federal, State, Local not specified - PPO | Source: Ambulatory Visit | Attending: Family Medicine | Admitting: Family Medicine

## 2012-04-01 DIAGNOSIS — Z1151 Encounter for screening for human papillomavirus (HPV): Secondary | ICD-10-CM | POA: Insufficient documentation

## 2012-04-01 DIAGNOSIS — Z01419 Encounter for gynecological examination (general) (routine) without abnormal findings: Secondary | ICD-10-CM | POA: Insufficient documentation

## 2012-12-07 ENCOUNTER — Other Ambulatory Visit: Payer: Self-pay

## 2012-12-07 DIAGNOSIS — Z1231 Encounter for screening mammogram for malignant neoplasm of breast: Secondary | ICD-10-CM

## 2013-01-03 ENCOUNTER — Ambulatory Visit
Admission: RE | Admit: 2013-01-03 | Discharge: 2013-01-03 | Disposition: A | Discharge status: Home / Self Care | Payer: Federal, State, Local not specified - PPO | Source: Ambulatory Visit

## 2013-01-03 DIAGNOSIS — Z1231 Encounter for screening mammogram for malignant neoplasm of breast: Secondary | ICD-10-CM

## 2013-04-12 ENCOUNTER — Other Ambulatory Visit: Payer: Self-pay | Admitting: Family Medicine

## 2013-12-04 ENCOUNTER — Other Ambulatory Visit: Payer: Self-pay

## 2013-12-04 DIAGNOSIS — Z1231 Encounter for screening mammogram for malignant neoplasm of breast: Secondary | ICD-10-CM

## 2014-01-11 ENCOUNTER — Ambulatory Visit
Admission: RE | Admit: 2014-01-11 | Discharge: 2014-01-11 | Disposition: A | Discharge status: Home / Self Care | Payer: Federal, State, Local not specified - PPO | Source: Ambulatory Visit

## 2014-01-11 DIAGNOSIS — Z1231 Encounter for screening mammogram for malignant neoplasm of breast: Secondary | ICD-10-CM

## 2014-01-12 ENCOUNTER — Other Ambulatory Visit: Payer: Self-pay | Admitting: Family Medicine

## 2014-01-12 DIAGNOSIS — R928 Other abnormal and inconclusive findings on diagnostic imaging of breast: Secondary | ICD-10-CM

## 2014-01-23 ENCOUNTER — Ambulatory Visit
Admission: RE | Admit: 2014-01-23 | Discharge: 2014-01-23 | Disposition: A | Discharge status: Home / Self Care | Payer: Federal, State, Local not specified - PPO | Source: Ambulatory Visit | Attending: Family Medicine | Admitting: Family Medicine

## 2014-01-23 DIAGNOSIS — R928 Other abnormal and inconclusive findings on diagnostic imaging of breast: Secondary | ICD-10-CM

## 2014-06-18 ENCOUNTER — Other Ambulatory Visit: Payer: Self-pay | Admitting: Family Medicine

## 2014-06-18 DIAGNOSIS — N6001 Solitary cyst of right breast: Secondary | ICD-10-CM

## 2014-07-24 ENCOUNTER — Other Ambulatory Visit: Payer: Self-pay | Admitting: Family Medicine

## 2014-07-24 ENCOUNTER — Ambulatory Visit
Admission: RE | Admit: 2014-07-24 | Discharge: 2014-07-24 | Disposition: A | Discharge status: Home / Self Care | Payer: Federal, State, Local not specified - PPO | Source: Ambulatory Visit | Attending: Family Medicine | Admitting: Family Medicine

## 2014-07-24 DIAGNOSIS — N6001 Solitary cyst of right breast: Secondary | ICD-10-CM

## 2014-11-05 ENCOUNTER — Ambulatory Visit (INDEPENDENT_AMBULATORY_CARE_PROVIDER_SITE_OTHER): Payer: Federal, State, Local not specified - PPO | Admitting: Podiatry

## 2014-11-05 ENCOUNTER — Encounter: Payer: Self-pay | Admitting: Podiatry

## 2014-11-05 VITALS — BP 136/81 | HR 72 | Resp 13 | Ht 65.5 in | Wt 176.0 lb

## 2014-11-05 DIAGNOSIS — L84 Corns and callosities: Secondary | ICD-10-CM | POA: Diagnosis not present

## 2014-11-05 DIAGNOSIS — M2040 Other hammer toe(s) (acquired), unspecified foot: Secondary | ICD-10-CM | POA: Diagnosis not present

## 2014-11-05 DIAGNOSIS — L600 Ingrowing nail: Secondary | ICD-10-CM | POA: Diagnosis not present

## 2014-11-05 NOTE — Progress Notes (Signed)
   Subjective:    Patient ID: Angie Bruce, female    DOB: 09-29-1945, 69 y.o.   MRN: [id number]  HPI    Review of Systems  All other systems reviewed and are negative.      Objective:   Physical Exam        Assessment & Plan:

## 2014-11-06 NOTE — Progress Notes (Signed)
Subjective:     Patient ID: Angie Bruce, female   DOB: March 02, 1946, 69 y.o.   MRN: [id number]  HPI patient presents with significant structural damage to both feet with large bunion formation digital deformities and lesions plantar aspect both feet. States they bother her and she is unsure whether surgery is necessary   Review of Systems  All other systems reviewed and are negative.      Objective:   Physical Exam  Constitutional: She is oriented to person, place, and time.  Cardiovascular: Intact distal pulses.   Musculoskeletal: Normal range of motion.  Neurological: She is oriented to person, place, and time.  Skin: Skin is warm.  Nursing note and vitals reviewed.  neurovascular status intact muscle strength adequate range of motion within normal limits. Patient's noted to have structural hyperostosis medial aspect first metatarsal head of both feet and is noted to have elevation of the lesser digits with keratotic tissue formation. Patient has plantar calluses bilateral     Assessment:     Structural changes to both feet with keratotic lesion formation and pain    Plan:     H&P and condition reviewed with patient. Debrided lesions on both feet today with no iatrogenic bleeding and reappoint to recheck

## 2014-12-27 ENCOUNTER — Other Ambulatory Visit: Payer: Self-pay | Admitting: Family Medicine

## 2014-12-27 DIAGNOSIS — N6001 Solitary cyst of right breast: Secondary | ICD-10-CM

## 2015-01-25 ENCOUNTER — Ambulatory Visit
Admission: RE | Admit: 2015-01-25 | Discharge: 2015-01-25 | Disposition: A | Discharge status: Home / Self Care | Payer: Federal, State, Local not specified - PPO | Source: Ambulatory Visit | Attending: Family Medicine | Admitting: Family Medicine

## 2015-01-25 ENCOUNTER — Other Ambulatory Visit: Payer: Federal, State, Local not specified - PPO

## 2015-01-25 DIAGNOSIS — N6001 Solitary cyst of right breast: Secondary | ICD-10-CM

## 2015-09-17 ENCOUNTER — Ambulatory Visit
Admission: RE | Admit: 2015-09-17 | Discharge: 2015-09-17 | Disposition: A | Discharge status: Home / Self Care | Payer: Federal, State, Local not specified - PPO | Source: Ambulatory Visit | Attending: Family Medicine | Admitting: Family Medicine

## 2015-09-17 ENCOUNTER — Other Ambulatory Visit: Payer: Self-pay | Admitting: Family Medicine

## 2015-09-17 DIAGNOSIS — M130 Polyarthritis, unspecified: Secondary | ICD-10-CM

## 2015-12-24 ENCOUNTER — Other Ambulatory Visit: Payer: Self-pay | Admitting: Family Medicine

## 2015-12-24 DIAGNOSIS — Z1231 Encounter for screening mammogram for malignant neoplasm of breast: Secondary | ICD-10-CM

## 2016-01-28 ENCOUNTER — Ambulatory Visit
Admission: RE | Admit: 2016-01-28 | Discharge: 2016-01-28 | Disposition: A | Discharge status: Home / Self Care | Payer: Federal, State, Local not specified - PPO | Source: Ambulatory Visit | Attending: Family Medicine | Admitting: Family Medicine

## 2016-01-28 DIAGNOSIS — Z1231 Encounter for screening mammogram for malignant neoplasm of breast: Secondary | ICD-10-CM

## 2016-03-25 ENCOUNTER — Ambulatory Visit (INDEPENDENT_AMBULATORY_CARE_PROVIDER_SITE_OTHER): Payer: Federal, State, Local not specified - PPO | Admitting: Podiatry

## 2016-03-25 ENCOUNTER — Ambulatory Visit (INDEPENDENT_AMBULATORY_CARE_PROVIDER_SITE_OTHER): Payer: Federal, State, Local not specified - PPO

## 2016-03-25 ENCOUNTER — Encounter: Payer: Self-pay | Admitting: Podiatry

## 2016-03-25 VITALS — BP 122/77 | HR 61 | Resp 16

## 2016-03-25 DIAGNOSIS — L600 Ingrowing nail: Secondary | ICD-10-CM | POA: Diagnosis not present

## 2016-03-25 DIAGNOSIS — L84 Corns and callosities: Secondary | ICD-10-CM | POA: Diagnosis not present

## 2016-03-25 DIAGNOSIS — M79671 Pain in right foot: Secondary | ICD-10-CM

## 2016-03-25 NOTE — Progress Notes (Signed)
Subjective:     Patient ID: Angie Bruce, female   DOB: 06-Nov-1945, 70 y.o.   MRN: [id number]  HPI patient presents stating her right big toenail has not been growing normally and at times bothers her and also she has lesions underneath both feet that can become sore   Review of Systems     Objective:   Physical Exam Neurovascular status intact with no changes in health with patient noted to have damaged right hallux nail that's crusted and painful when pressed along with plantar keratotic lesion bilateral first and fifth metatarsal    Assessment:     Chronic ingrown toenail deformity right hallux along with structural changes consistent with cavus foot type and structural lesion formation    Plan:     Discussed both conditions recommended nail removal and she scheduled after the first of the year due to her schedule and today I debrided lesions on both feet with no iatrogenic bleeding noted

## 2016-05-27 ENCOUNTER — Ambulatory Visit (INDEPENDENT_AMBULATORY_CARE_PROVIDER_SITE_OTHER): Payer: Medicare Other | Admitting: Podiatry

## 2016-05-27 ENCOUNTER — Encounter: Payer: Self-pay | Admitting: Podiatry

## 2016-05-27 VITALS — BP 120/71 | HR 69 | Resp 16

## 2016-05-27 DIAGNOSIS — L600 Ingrowing nail: Secondary | ICD-10-CM

## 2016-05-27 NOTE — Patient Instructions (Signed)

## 2016-05-27 NOTE — Progress Notes (Signed)
Subjective:     Patient ID: Angie Bruce, female   DOB: 19-Jul-1945, 72 y.o.   MRN: [id number]  HPI patient presents with a thick  damaged right hallux nail that's dystrophic loose and patient cannot wear shoe gear with comfortably   Review of Systems     Objective:   Physical Exam Neurovascular status intact muscle strength adequate with damaged right hallux nail that's loose and painful when pressed from a dorsal direction    Assessment:     Mycotic nail infection with trauma to the right hallux with pain    Plan:     H&P condition reviewed and recommended nail removal. I explained procedure and risk and today I infiltrated 60 mg like Marcaine mixture remove the hallux nail exposed matrix and applied phenol 3 applications 30 seconds followed by alcohol lavage and sterile dressing. Gave instructions on soaks and reappoint

## 2016-06-10 DIAGNOSIS — M797 Fibromyalgia: Secondary | ICD-10-CM | POA: Diagnosis not present

## 2016-06-10 DIAGNOSIS — J0100 Acute maxillary sinusitis, unspecified: Secondary | ICD-10-CM | POA: Diagnosis not present

## 2016-06-10 DIAGNOSIS — E785 Hyperlipidemia, unspecified: Secondary | ICD-10-CM | POA: Diagnosis not present

## 2016-06-10 DIAGNOSIS — E789 Disorder of lipoprotein metabolism, unspecified: Secondary | ICD-10-CM | POA: Diagnosis not present

## 2016-06-10 DIAGNOSIS — I10 Essential (primary) hypertension: Secondary | ICD-10-CM | POA: Diagnosis not present

## 2016-06-10 DIAGNOSIS — M130 Polyarthritis, unspecified: Secondary | ICD-10-CM | POA: Diagnosis not present

## 2016-06-10 DIAGNOSIS — R7309 Other abnormal glucose: Secondary | ICD-10-CM | POA: Diagnosis not present

## 2016-06-17 DIAGNOSIS — H2513 Age-related nuclear cataract, bilateral: Secondary | ICD-10-CM | POA: Diagnosis not present

## 2016-06-17 DIAGNOSIS — H401131 Primary open-angle glaucoma, bilateral, mild stage: Secondary | ICD-10-CM | POA: Diagnosis not present

## 2016-07-13 ENCOUNTER — Ambulatory Visit (INDEPENDENT_AMBULATORY_CARE_PROVIDER_SITE_OTHER): Payer: Medicare Other | Admitting: Podiatry

## 2016-07-13 DIAGNOSIS — L03031 Cellulitis of right toe: Secondary | ICD-10-CM

## 2016-07-13 NOTE — Progress Notes (Signed)
Subjective:     Patient ID: Angie Bruce, female   DOB: 1946/02/13, 71 y.o.   MRN: [id number]  HPI patient presents stating she was just concerned about the slight drainage from the proximal portion of her nail bed which removed about 4 weeks ago   Review of Systems     Objective:   Physical Exam Neurovascular status intact negative Homan sign was noted with patient found to have slight proximal drainage in the right hallux nail bed that was removed. There is no proximal edema erythema or drainage noted    Assessment:     Paronychia infection right hallux    Plan:     Advised on soaks therapy bandage therapy and utilization of ointment. Patient will not use oral antibiotics currently but if symptoms were to get worse or any redness or drainage were to recur or odor she is to reappoint immediately

## 2016-07-13 NOTE — Patient Instructions (Signed)

## 2016-07-29 DIAGNOSIS — J3089 Other allergic rhinitis: Secondary | ICD-10-CM | POA: Diagnosis not present

## 2016-07-29 DIAGNOSIS — J301 Allergic rhinitis due to pollen: Secondary | ICD-10-CM | POA: Diagnosis not present

## 2016-07-29 DIAGNOSIS — H1045 Other chronic allergic conjunctivitis: Secondary | ICD-10-CM | POA: Diagnosis not present

## 2016-08-03 DIAGNOSIS — L03011 Cellulitis of right finger: Secondary | ICD-10-CM | POA: Diagnosis not present

## 2016-10-14 DIAGNOSIS — H10521 Angular blepharoconjunctivitis, right eye: Secondary | ICD-10-CM | POA: Diagnosis not present

## 2016-10-14 DIAGNOSIS — H2513 Age-related nuclear cataract, bilateral: Secondary | ICD-10-CM | POA: Diagnosis not present

## 2016-10-14 DIAGNOSIS — H40033 Anatomical narrow angle, bilateral: Secondary | ICD-10-CM | POA: Diagnosis not present

## 2016-11-20 DIAGNOSIS — E789 Disorder of lipoprotein metabolism, unspecified: Secondary | ICD-10-CM | POA: Diagnosis not present

## 2016-11-20 DIAGNOSIS — R21 Rash and other nonspecific skin eruption: Secondary | ICD-10-CM | POA: Diagnosis not present

## 2016-11-20 DIAGNOSIS — I10 Essential (primary) hypertension: Secondary | ICD-10-CM | POA: Diagnosis not present

## 2016-12-21 ENCOUNTER — Other Ambulatory Visit: Payer: Self-pay | Admitting: Family Medicine

## 2016-12-21 DIAGNOSIS — Z1231 Encounter for screening mammogram for malignant neoplasm of breast: Secondary | ICD-10-CM

## 2017-01-21 DIAGNOSIS — J188 Other pneumonia, unspecified organism: Secondary | ICD-10-CM | POA: Diagnosis not present

## 2017-01-22 ENCOUNTER — Ambulatory Visit
Admission: RE | Admit: 2017-01-22 | Discharge: 2017-01-22 | Disposition: A | Discharge status: Home / Self Care | Payer: Medicare Other | Source: Ambulatory Visit | Attending: Family Medicine | Admitting: Family Medicine

## 2017-01-22 ENCOUNTER — Other Ambulatory Visit: Payer: Self-pay | Admitting: Family Medicine

## 2017-01-22 DIAGNOSIS — R05 Cough: Secondary | ICD-10-CM | POA: Diagnosis not present

## 2017-01-22 DIAGNOSIS — R0689 Other abnormalities of breathing: Secondary | ICD-10-CM

## 2017-02-03 ENCOUNTER — Ambulatory Visit: Payer: Federal, State, Local not specified - PPO

## 2017-02-10 DIAGNOSIS — J188 Other pneumonia, unspecified organism: Secondary | ICD-10-CM | POA: Diagnosis not present

## 2017-02-16 ENCOUNTER — Ambulatory Visit
Admission: RE | Admit: 2017-02-16 | Discharge: 2017-02-16 | Disposition: A | Discharge status: Home / Self Care | Payer: Medicare Other | Source: Ambulatory Visit | Attending: Family Medicine | Admitting: Family Medicine

## 2017-02-16 DIAGNOSIS — Z1231 Encounter for screening mammogram for malignant neoplasm of breast: Secondary | ICD-10-CM | POA: Diagnosis not present

## 2017-02-22 DIAGNOSIS — Z23 Encounter for immunization: Secondary | ICD-10-CM | POA: Diagnosis not present

## 2017-03-03 DIAGNOSIS — Z0000 Encounter for general adult medical examination without abnormal findings: Secondary | ICD-10-CM | POA: Diagnosis not present

## 2017-04-29 DIAGNOSIS — H40033 Anatomical narrow angle, bilateral: Secondary | ICD-10-CM | POA: Diagnosis not present

## 2017-04-29 DIAGNOSIS — H2513 Age-related nuclear cataract, bilateral: Secondary | ICD-10-CM | POA: Diagnosis not present

## 2017-06-02 DIAGNOSIS — I10 Essential (primary) hypertension: Secondary | ICD-10-CM | POA: Diagnosis not present

## 2017-06-02 DIAGNOSIS — M797 Fibromyalgia: Secondary | ICD-10-CM | POA: Diagnosis not present

## 2017-06-02 DIAGNOSIS — J039 Acute tonsillitis, unspecified: Secondary | ICD-10-CM | POA: Diagnosis not present

## 2017-06-02 DIAGNOSIS — E785 Hyperlipidemia, unspecified: Secondary | ICD-10-CM | POA: Diagnosis not present

## 2017-07-08 DIAGNOSIS — I10 Essential (primary) hypertension: Secondary | ICD-10-CM | POA: Diagnosis not present

## 2017-07-08 DIAGNOSIS — E782 Mixed hyperlipidemia: Secondary | ICD-10-CM | POA: Diagnosis not present

## 2017-07-08 DIAGNOSIS — N761 Subacute and chronic vaginitis: Secondary | ICD-10-CM | POA: Diagnosis not present

## 2017-07-08 DIAGNOSIS — N390 Urinary tract infection, site not specified: Secondary | ICD-10-CM | POA: Diagnosis not present

## 2017-07-14 DIAGNOSIS — M8588 Other specified disorders of bone density and structure, other site: Secondary | ICD-10-CM | POA: Diagnosis not present

## 2017-07-20 DIAGNOSIS — J301 Allergic rhinitis due to pollen: Secondary | ICD-10-CM | POA: Diagnosis not present

## 2017-07-20 DIAGNOSIS — J3089 Other allergic rhinitis: Secondary | ICD-10-CM | POA: Diagnosis not present

## 2017-07-20 DIAGNOSIS — H1045 Other chronic allergic conjunctivitis: Secondary | ICD-10-CM | POA: Diagnosis not present

## 2017-07-28 DIAGNOSIS — Z01419 Encounter for gynecological examination (general) (routine) without abnormal findings: Secondary | ICD-10-CM | POA: Diagnosis not present

## 2017-07-28 DIAGNOSIS — Z124 Encounter for screening for malignant neoplasm of cervix: Secondary | ICD-10-CM | POA: Diagnosis not present

## 2017-09-06 DIAGNOSIS — J3089 Other allergic rhinitis: Secondary | ICD-10-CM | POA: Diagnosis not present

## 2017-09-06 DIAGNOSIS — J301 Allergic rhinitis due to pollen: Secondary | ICD-10-CM | POA: Diagnosis not present

## 2017-09-06 DIAGNOSIS — J019 Acute sinusitis, unspecified: Secondary | ICD-10-CM | POA: Diagnosis not present

## 2017-09-06 DIAGNOSIS — H1045 Other chronic allergic conjunctivitis: Secondary | ICD-10-CM | POA: Diagnosis not present

## 2017-09-14 DIAGNOSIS — J320 Chronic maxillary sinusitis: Secondary | ICD-10-CM | POA: Diagnosis not present

## 2017-09-14 DIAGNOSIS — J301 Allergic rhinitis due to pollen: Secondary | ICD-10-CM | POA: Diagnosis not present

## 2017-09-14 DIAGNOSIS — J3089 Other allergic rhinitis: Secondary | ICD-10-CM | POA: Diagnosis not present

## 2017-09-14 DIAGNOSIS — J039 Acute tonsillitis, unspecified: Secondary | ICD-10-CM | POA: Diagnosis not present

## 2017-09-14 DIAGNOSIS — J40 Bronchitis, not specified as acute or chronic: Secondary | ICD-10-CM | POA: Diagnosis not present

## 2017-09-14 DIAGNOSIS — J322 Chronic ethmoidal sinusitis: Secondary | ICD-10-CM | POA: Diagnosis not present

## 2017-09-14 DIAGNOSIS — J040 Acute laryngitis: Secondary | ICD-10-CM | POA: Diagnosis not present

## 2017-09-24 DIAGNOSIS — J322 Chronic ethmoidal sinusitis: Secondary | ICD-10-CM | POA: Diagnosis not present

## 2017-09-24 DIAGNOSIS — J040 Acute laryngitis: Secondary | ICD-10-CM | POA: Diagnosis not present

## 2017-09-24 DIAGNOSIS — J320 Chronic maxillary sinusitis: Secondary | ICD-10-CM | POA: Diagnosis not present

## 2017-10-14 DIAGNOSIS — R61 Generalized hyperhidrosis: Secondary | ICD-10-CM | POA: Diagnosis not present

## 2017-10-14 DIAGNOSIS — R799 Abnormal finding of blood chemistry, unspecified: Secondary | ICD-10-CM | POA: Diagnosis not present

## 2017-10-14 DIAGNOSIS — M7918 Myalgia, other site: Secondary | ICD-10-CM | POA: Diagnosis not present

## 2017-10-14 DIAGNOSIS — J399 Disease of upper respiratory tract, unspecified: Secondary | ICD-10-CM | POA: Diagnosis not present

## 2017-10-14 DIAGNOSIS — E782 Mixed hyperlipidemia: Secondary | ICD-10-CM | POA: Diagnosis not present

## 2017-10-15 ENCOUNTER — Ambulatory Visit
Admission: RE | Admit: 2017-10-15 | Discharge: 2017-10-15 | Disposition: A | Discharge status: Home / Self Care | Payer: Medicare Other | Source: Ambulatory Visit | Attending: Family Medicine | Admitting: Family Medicine

## 2017-10-15 ENCOUNTER — Other Ambulatory Visit: Payer: Self-pay | Admitting: Family Medicine

## 2017-10-15 DIAGNOSIS — J399 Disease of upper respiratory tract, unspecified: Secondary | ICD-10-CM

## 2017-10-15 DIAGNOSIS — R61 Generalized hyperhidrosis: Secondary | ICD-10-CM

## 2017-10-15 DIAGNOSIS — M7918 Myalgia, other site: Secondary | ICD-10-CM

## 2017-10-15 DIAGNOSIS — R05 Cough: Secondary | ICD-10-CM | POA: Diagnosis not present

## 2017-10-15 DIAGNOSIS — R911 Solitary pulmonary nodule: Secondary | ICD-10-CM

## 2017-10-16 DIAGNOSIS — J188 Other pneumonia, unspecified organism: Secondary | ICD-10-CM | POA: Diagnosis not present

## 2017-10-18 ENCOUNTER — Ambulatory Visit
Admission: RE | Admit: 2017-10-18 | Discharge: 2017-10-18 | Disposition: A | Discharge status: Home / Self Care | Payer: Medicare Other | Source: Ambulatory Visit | Attending: Family Medicine | Admitting: Family Medicine

## 2017-10-18 DIAGNOSIS — R911 Solitary pulmonary nodule: Secondary | ICD-10-CM

## 2017-10-18 MED ORDER — IOPAMIDOL (ISOVUE-300) INJECTION 61%
75.0000 mL | Freq: Once | INTRAVENOUS | Status: AC | PRN
  Administered 2017-10-18: 75 mL via INTRAVENOUS

## 2017-10-22 DIAGNOSIS — J984 Other disorders of lung: Secondary | ICD-10-CM | POA: Diagnosis not present

## 2017-10-22 DIAGNOSIS — J180 Bronchopneumonia, unspecified organism: Secondary | ICD-10-CM | POA: Diagnosis not present

## 2017-10-26 ENCOUNTER — Other Ambulatory Visit (HOSPITAL_COMMUNITY): Payer: Self-pay | Admitting: Family Medicine

## 2017-10-26 DIAGNOSIS — R918 Other nonspecific abnormal finding of lung field: Secondary | ICD-10-CM

## 2017-10-26 DIAGNOSIS — R9389 Abnormal findings on diagnostic imaging of other specified body structures: Secondary | ICD-10-CM

## 2017-10-27 ENCOUNTER — Other Ambulatory Visit: Payer: Medicare Other

## 2017-10-28 DIAGNOSIS — H40033 Anatomical narrow angle, bilateral: Secondary | ICD-10-CM | POA: Diagnosis not present

## 2017-10-28 DIAGNOSIS — H2513 Age-related nuclear cataract, bilateral: Secondary | ICD-10-CM | POA: Diagnosis not present

## 2017-10-29 ENCOUNTER — Encounter (HOSPITAL_COMMUNITY)
Admission: RE | Admit: 2017-10-29 | Discharge: 2017-10-29 | Disposition: A | Discharge status: Home / Self Care | Payer: Medicare Other | Source: Ambulatory Visit | Attending: Family Medicine | Admitting: Family Medicine

## 2017-10-29 DIAGNOSIS — R918 Other nonspecific abnormal finding of lung field: Secondary | ICD-10-CM | POA: Diagnosis not present

## 2017-10-29 DIAGNOSIS — R9389 Abnormal findings on diagnostic imaging of other specified body structures: Secondary | ICD-10-CM | POA: Diagnosis not present

## 2017-10-29 LAB — GLUCOSE, CAPILLARY: GLUCOSE-CAPILLARY: 94 mg/dL (ref 65–99)

## 2017-10-29 IMAGING — PT NM PET TUM IMG INITIAL (PI) SKULL BASE T - THIGH
1 series · 6 of 6 positions shown · non-contrast
Comparison: CT chest 10/18/2017

CLINICAL DATA: Initial treatment strategy for pulmonary nodules.

EXAM:
NUCLEAR MEDICINE PET SKULL BASE TO THIGH
TECHNIQUE: 8.8 mCi F-18 FDG was injected intravenously. Full-ring PET imaging
was performed from the skull base to thigh after the radiotracer. CT
data was obtained and used for attenuation correction and anatomic
localization.
Fasting blood glucose: 94 mg/dl

[Series 1079: results mm oncology reading · 1.0mm · 0.51mm/px · 6 of 6 slices shown]
[im 1/6]
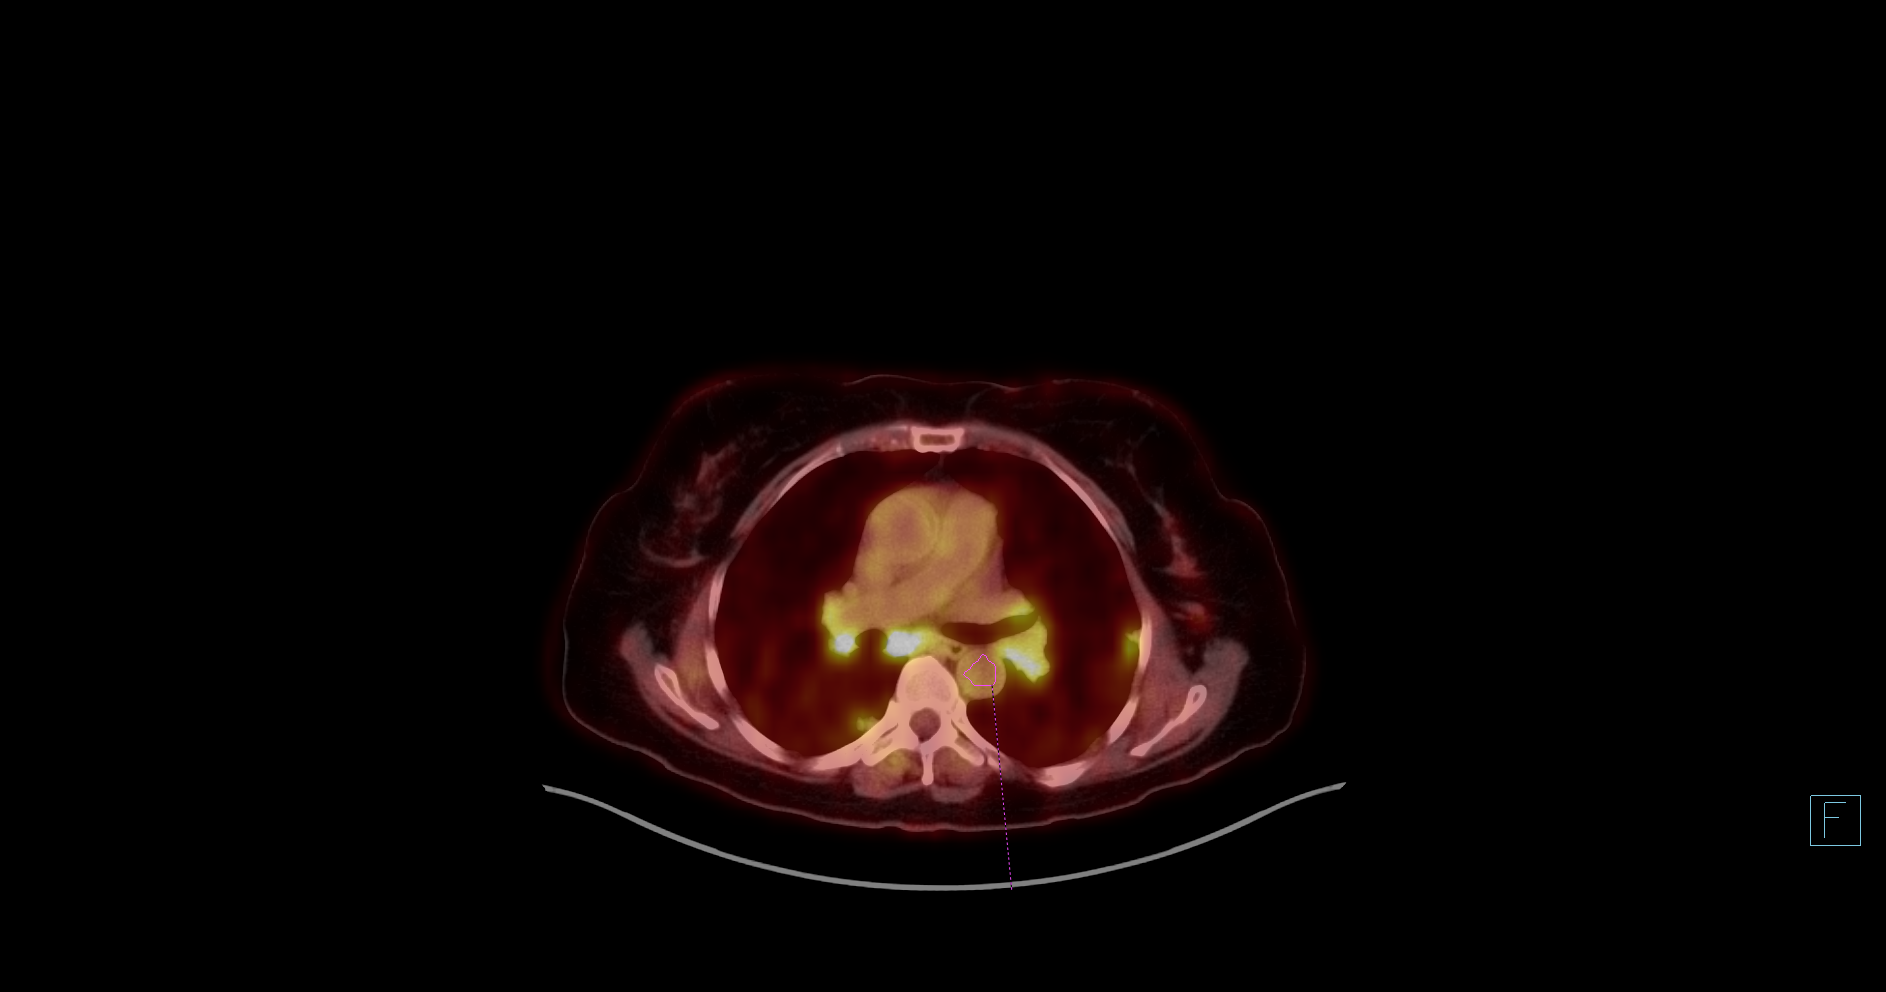
[im 2/6]
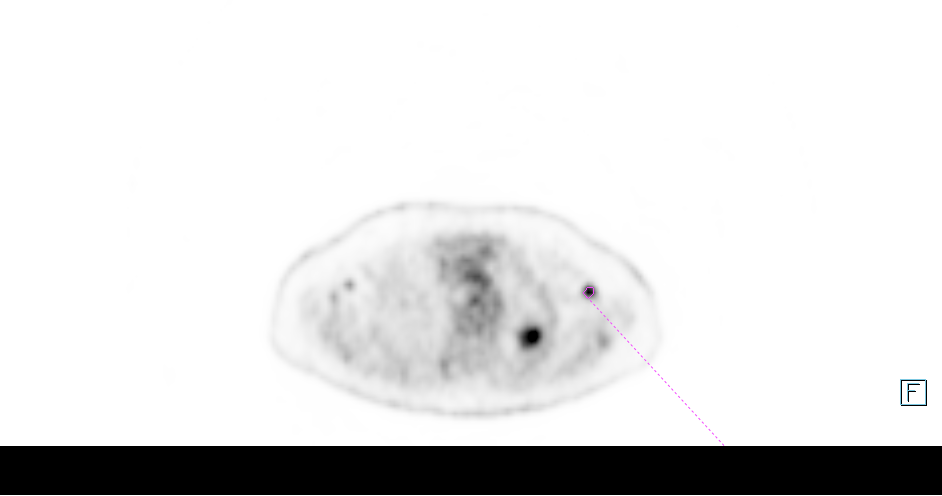
[im 3/6]
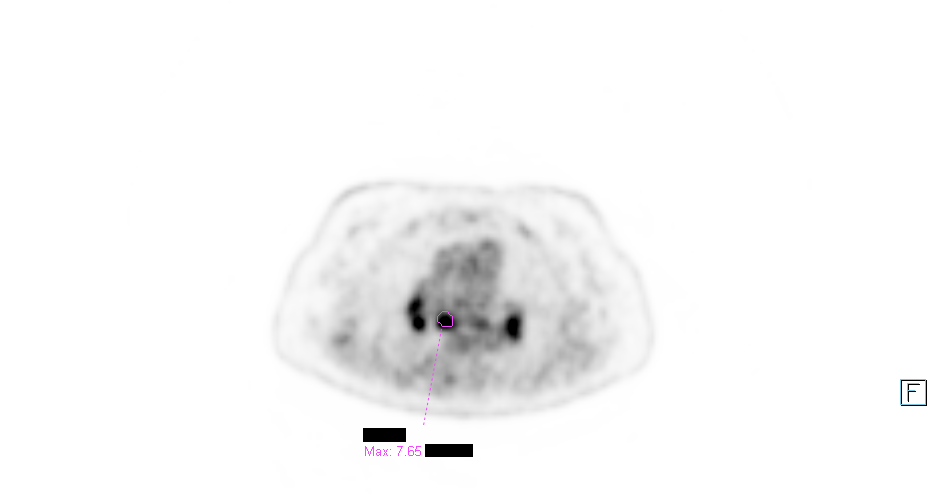
[im 4/6]
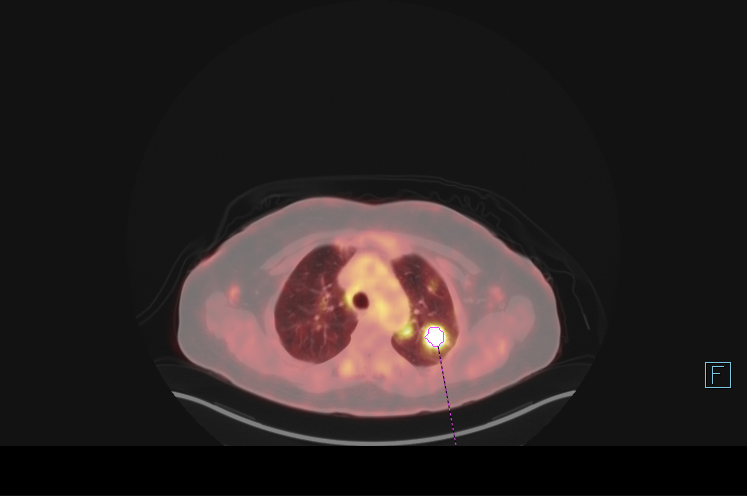
[im 5/6]
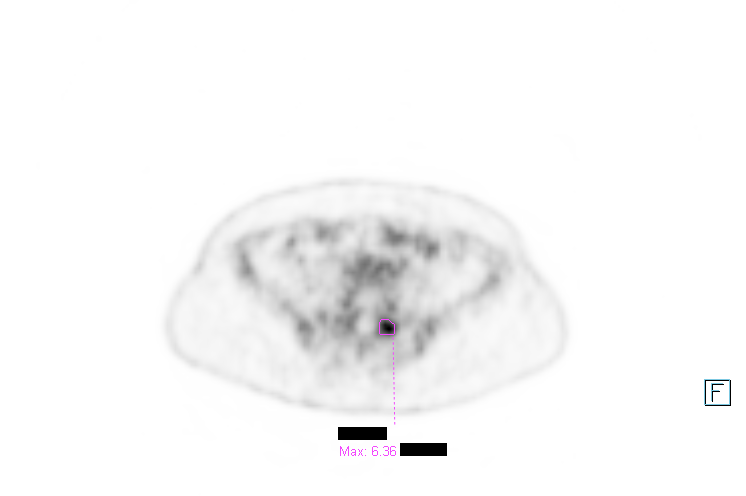
[im 6/6]
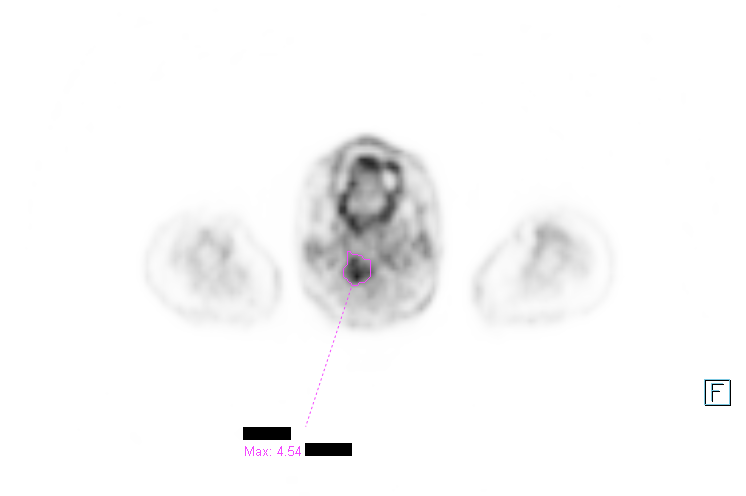

[6 of 6 positions shown; findings below may reference images not displayed]

FINDINGS: Mediastinal blood pool activity: SUV max

NECK: Small hypermetabolic left cervical level II lymph node
evident.

Incidental CT findings: none

CHEST: 8 mm left axillary lymph node is hypermetabolic with SUV max
= 5.5. Small right axillary lymph nodes are hypermetabolic.
Hypermetabolic mediastinal and bilateral hilar lymphadenopathy
evident. 1.4 cm short axis right subcarinal lymph node (65/4)
demonstrates SUV max = 7.7. Bilateral pulmonary lesions are
hypermetabolic. Dominant left upper lobe lesion (image 19/series 8)
demonstrates SUV max =

Incidental CT findings: Atherosclerotic calcification is noted in
the wall of the thoracic aorta.

ABDOMEN/PELVIS: Small hypermetabolic foci are identified in the
region of the gastrohepatic and hepato duodenal ligaments. No
underlying lymphadenopathy is evident on noncontrast CT imaging
although metastatic nodal involvement in these regions is a concern.

Incidental CT findings: Calcified fibroids are evident in the
uterus.

SKELETON: No widespread bony metastatic involvement evident although
areas of FDG accumulation are identified in the left sacrum (SUV max
= 6.4) and right C2 vertebral body (SUV max = 4.5).

Incidental CT findings: none
IMPRESSION: 1. Hypermetabolic pulmonary nodules associated with hypermetabolic
bilateral axillary, bilateral hilar and mediastinal lymphadenopathy.
Dominant pulmonary lesion is in the left upper lobe and may
represent a primary neoplasm or dominant metastatic focus.
2. Subtle focal FDG accumulation in the region of the gastrohepatic
and hepato duodenal ligaments. No underlying lymphadenopathy is
evident, but metastatic disease a concern. Close attention in these
regions on follow-up recommended.
3. Hypermetabolic uptake in the right C2 vertebral body and left
sacrum. The left sacral uptake is in a region of marked
degeneration, but hypermetabolism is higher than typically seen for
degenerative change. All bony metastatic involvement is a
possibility, it is not considered definite on this study. Attention
on follow-up recommended.

## 2017-10-29 MED ORDER — FLUDEOXYGLUCOSE F - 18 (FDG) INJECTION
8.7400 | Freq: Once | INTRAVENOUS | Status: AC
  Administered 2017-10-29: 8.74 via INTRAVENOUS

## 2017-11-05 DIAGNOSIS — C3490 Malignant neoplasm of unspecified part of unspecified bronchus or lung: Secondary | ICD-10-CM | POA: Diagnosis not present

## 2017-11-11 ENCOUNTER — Other Ambulatory Visit (HOSPITAL_COMMUNITY): Payer: Self-pay | Admitting: Family Medicine

## 2017-11-11 DIAGNOSIS — R590 Localized enlarged lymph nodes: Secondary | ICD-10-CM

## 2017-11-11 DIAGNOSIS — R911 Solitary pulmonary nodule: Secondary | ICD-10-CM

## 2017-11-15 DIAGNOSIS — R918 Other nonspecific abnormal finding of lung field: Secondary | ICD-10-CM | POA: Diagnosis not present

## 2017-11-19 ENCOUNTER — Other Ambulatory Visit: Payer: Self-pay | Admitting: Student

## 2017-11-22 ENCOUNTER — Ambulatory Visit (HOSPITAL_COMMUNITY)
Admission: RE | Admit: 2017-11-22 | Discharge: 2017-11-22 | Disposition: A | Discharge status: Home / Self Care | Payer: Medicare Other | Source: Ambulatory Visit | Attending: Family Medicine | Admitting: Family Medicine

## 2017-11-22 DIAGNOSIS — I898 Other specified noninfective disorders of lymphatic vessels and lymph nodes: Secondary | ICD-10-CM | POA: Diagnosis not present

## 2017-11-22 DIAGNOSIS — D259 Leiomyoma of uterus, unspecified: Secondary | ICD-10-CM | POA: Insufficient documentation

## 2017-11-22 DIAGNOSIS — I10 Essential (primary) hypertension: Secondary | ICD-10-CM | POA: Diagnosis not present

## 2017-11-22 DIAGNOSIS — Z882 Allergy status to sulfonamides status: Secondary | ICD-10-CM | POA: Insufficient documentation

## 2017-11-22 DIAGNOSIS — R918 Other nonspecific abnormal finding of lung field: Secondary | ICD-10-CM | POA: Diagnosis not present

## 2017-11-22 DIAGNOSIS — R590 Localized enlarged lymph nodes: Secondary | ICD-10-CM | POA: Diagnosis not present

## 2017-11-22 DIAGNOSIS — Z79899 Other long term (current) drug therapy: Secondary | ICD-10-CM | POA: Diagnosis not present

## 2017-11-22 LAB — CBC
HCT: 38.8 % (ref 36.0–46.0)
Hemoglobin: 12.5 g/dL (ref 12.0–15.0)
MCH: 29.9 pg (ref 26.0–34.0)
MCHC: 32.2 g/dL (ref 30.0–36.0)
MCV: 92.8 fL (ref 78.0–100.0)
PLATELETS: 199 10*3/uL (ref 150–400)
RBC: 4.18 MIL/uL (ref 3.87–5.11)
RDW: 14.1 % (ref 11.5–15.5)
WBC: 8.6 10*3/uL (ref 4.0–10.5)

## 2017-11-22 LAB — APTT: aPTT: 31 seconds (ref 24–36)

## 2017-11-22 LAB — PROTIME-INR
INR: 0.97
PROTHROMBIN TIME: 12.8 s (ref 11.4–15.2)

## 2017-11-22 MED ORDER — LIDOCAINE-EPINEPHRINE 1 %-1:100000 IJ SOLN
INTRAMUSCULAR | Status: DC
Start: 2017-11-22 — End: 2017-11-23
  Filled 2017-11-22: qty 1

## 2017-11-22 MED ORDER — FENTANYL CITRATE (PF) 100 MCG/2ML IJ SOLN
INTRAMUSCULAR | Status: AC
  Filled 2017-11-22: qty 2

## 2017-11-22 MED ORDER — LIDOCAINE HCL (PF) 1 % IJ SOLN
INTRAMUSCULAR | Status: AC
  Filled 2017-11-22: qty 30

## 2017-11-22 MED ORDER — MIDAZOLAM HCL 2 MG/2ML IJ SOLN
INTRAMUSCULAR | Status: AC
  Filled 2017-11-22: qty 2

## 2017-11-22 MED ORDER — SODIUM CHLORIDE 0.9 % IV SOLN: INTRAVENOUS | Status: DC

## 2017-11-22 MED ORDER — MIDAZOLAM HCL 2 MG/2ML IJ SOLN
INTRAMUSCULAR | Status: AC | PRN
  Administered 2017-11-22: 0.5 mg via INTRAVENOUS
  Administered 2017-11-22: 1 mg via INTRAVENOUS

## 2017-11-22 MED ORDER — FENTANYL CITRATE (PF) 100 MCG/2ML IJ SOLN
INTRAMUSCULAR | Status: AC | PRN
  Administered 2017-11-22: 50 ug via INTRAVENOUS
  Administered 2017-11-22: 25 ug via INTRAVENOUS

## 2017-11-22 NOTE — H&P (Signed)
Chief Complaint: Patient was seen in consultation today for lymphadenopathy  Referring Physician(s): Bland,Veita  Supervising Physician: Sandi Mariscal  Patient Status: [facility] - Out-pt  History of Present Illness: Angie Bruce is a 72 y.o. female with past medical history of hypertension who was recently found to have a lung mass by Chest CT.   CT Chest w contrast 10/18/17: Multiple irregular densities are noted throughout both lungs, with the largest measuring 3.2 cm in left upper lobe. This may simply represent multifocal pneumonia, but neoplasm or metastatic disease cannot be excluded. Also noted is significant mediastinal adenopathy. Clinical correlation is recommended to determine if the patient demonstrates clinical signs of infection. If there are clinical signs of infection, follow-up unenhanced CT scan in 10-14 days would be recommended. If these lesions are stable in size and appearance on follow-up exam, then PET scan would be recommended at that time to evaluate for possible malignancy and or metastatic disease.  PET 10/29/17: 1. Hypermetabolic pulmonary nodules associated with hypermetabolic bilateral axillary, bilateral hilar and mediastinal lymphadenopathy. Dominant pulmonary lesion is in the left upper lobe and may represent a primary neoplasm or dominant metastatic focus. 2. Subtle focal FDG accumulation in the region of the gastrohepatic and hepato duodenal ligaments. No underlying lymphadenopathy is evident, but metastatic disease a concern. Close attention in these regions on follow-up recommended. 3. Hypermetabolic uptake in the right C2 vertebral body and left sacrum. The left sacral uptake is in a region of marked degeneration, but hypermetabolism is higher than typically seen for degenerative change. All bony metastatic involvement is a possibility, it is not considered definite on this study. Attention on follow-up recommended.   IR consulted for possible biopsy  at the request of Dr. Criss Rosales.  Patient case reviewed and approves for lymph node biopsy by Dr. Vernard Gambles.   Past Medical History:  Diagnosis Date  . Hypertension     No past surgical history on file.  Allergies: Sulfa antibiotics  Medications: Prior to Admission medications   Medication Sig Start Date End Date Taking? Authorizing Provider  azelastine (ASTELIN) 0.1 % nasal spray Place 1 spray into both nostrils 2 (two) times daily.    Yes [provider]  calcium-vitamin D (OSCAL WITH D) 500-200 MG-UNIT tablet Take 1 tablet by mouth daily.   Yes [provider]  Cholecalciferol (VITAMIN D-3) 1000 units CAPS Take 1,000 Units by mouth daily.    Yes [provider]  Fish Oil-Cholecalciferol (OMEGA-3 FISH OIL-VITAMIN D3) 1200-1000 MG-UNIT CAPS Take 1 capsule by mouth daily.   Yes [provider]  levocetirizine (XYZAL) 5 MG tablet Take 5 mg by mouth at bedtime.    Yes [provider]  Methylcobalamin (B-12) 5000 MCG TBDP Take 5,000 mcg by mouth daily.   Yes [provider]  montelukast (SINGULAIR) 10 MG tablet Take 10 mg by mouth at bedtime.   Yes [provider]  Multiple Vitamin (MULITIVITAMIN WITH MINERALS) TABS Take 1 tablet by mouth daily.   Yes [provider]  naproxen (NAPROSYN) 500 MG tablet Take 500 mg by mouth daily.  05/07/16  Yes [provider]  Omega-3 Fatty Acids (FISH OIL) 1200 MG CAPS Take 1,200 mg by mouth daily.   Yes [provider]  triamterene-hydrochlorothiazide (MAXZIDE-25) 37.5-25 MG per tablet Take 1 tablet by mouth daily.   Yes [provider]     Family History  Problem Relation Age of Onset  . Stomach cancer Mother   . Stroke Father  Social History   Socioeconomic History  . Marital status: Married    Spouse name: Not on file  . Number of children: Not on file  . Years of education: Not on file  . Highest education level: Not on file  Occupational  History  . Not on file  Social Needs  . Financial resource strain: Not on file  . Food insecurity:    Worry: Not on file    Inability: Not on file  . Transportation needs:    Medical: Not on file    Non-medical: Not on file  Tobacco Use  . Smoking status: Never Smoker  Substance and Sexual Activity  . Alcohol use: No  . Drug use: No  . Sexual activity: Not on file  Lifestyle  . Physical activity:    Days per week: Not on file    Minutes per session: Not on file  . Stress: Not on file  Relationships  . Social connections:    Talks on phone: Not on file    Gets together: Not on file    Attends religious service: Not on file    Active member of club or organization: Not on file    Attends meetings of clubs or organizations: Not on file    Relationship status: Not on file  Other Topics Concern  . Not on file  Social History Narrative  . Not on file     Review of Systems: A 12 point ROS discussed and pertinent positives are indicated in the HPI above.  All other systems are negative.  Review of Systems  Constitutional: Negative for fatigue and fever.  Respiratory: Negative for cough and shortness of breath.   Cardiovascular: Negative for chest pain.  Gastrointestinal: Negative for abdominal pain.  Musculoskeletal: Negative for back pain.  Psychiatric/Behavioral: Negative for behavioral problems and confusion.    Vital Signs: BP 138/80 (BP Location: Right Arm)   Pulse 60   Temp (!) 97.5 F (36.4 C) (Oral)   Ht 5\' 6"  (1.676 m)   Wt 179 lb (81.2 kg)   SpO2 100%   BMI 28.89 kg/m   Physical Exam  Constitutional: She is oriented to person, place, and time. She appears well-developed.  Cardiovascular: Normal rate, regular rhythm and normal heart sounds.  Pulmonary/Chest: Effort normal and breath sounds normal. No respiratory distress.  Abdominal: Soft.  Musculoskeletal: Normal range of motion.  Neurological: She is alert and oriented to person, place, and time.    Skin: Skin is warm and dry.  No palpable axillary nodes noted on the left  Psychiatric: She has a normal mood and affect. Her behavior is normal. Judgment and thought content normal.  Nursing note and vitals reviewed.    MD Evaluation Airway: WNL Heart: WNL Abdomen: WNL Chest/ Lungs: WNL ASA  Classification: 2 Mallampati/Airway Score: One   Imaging: Nm Pet Image Initial (pi) Skull Base To Thigh  Result Date: 10/29/2017 CLINICAL DATA:  Initial treatment strategy for pulmonary nodules. EXAM: NUCLEAR MEDICINE PET SKULL BASE TO THIGH TECHNIQUE: 8.8 mCi F-18 FDG was injected intravenously. Full-ring PET imaging was performed from the skull base to thigh after the radiotracer. CT data was obtained and used for attenuation correction and anatomic localization. Fasting blood glucose: 94 mg/dl COMPARISON:  CT chest 10/18/2017 FINDINGS: Mediastinal blood pool activity: SUV max 2.7 NECK: Small hypermetabolic left cervical level II lymph node evident. Incidental CT findings: none CHEST: 8 mm left axillary lymph node is hypermetabolic with SUV max = 5.5. Small right  axillary lymph nodes are hypermetabolic. Hypermetabolic mediastinal and bilateral hilar lymphadenopathy evident. 1.4 cm short axis right subcarinal lymph node (65/4) demonstrates SUV max = 7.7. Bilateral pulmonary lesions are hypermetabolic. Dominant left upper lobe lesion (image 19/series 8) demonstrates SUV max = 14.3 Incidental CT findings: Atherosclerotic calcification is noted in the wall of the thoracic aorta. ABDOMEN/PELVIS: Small hypermetabolic foci are identified in the region of the gastrohepatic and hepato duodenal ligaments. No underlying lymphadenopathy is evident on noncontrast CT imaging although metastatic nodal involvement in these regions is a concern. Incidental CT findings: Calcified fibroids are evident in the uterus. SKELETON: No widespread bony metastatic involvement evident although areas of FDG accumulation are identified  in the left sacrum (SUV max = 6.4) and right C2 vertebral body (SUV max = 4.5). Incidental CT findings: none IMPRESSION: 1. Hypermetabolic pulmonary nodules associated with hypermetabolic bilateral axillary, bilateral hilar and mediastinal lymphadenopathy. Dominant pulmonary lesion is in the left upper lobe and may represent a primary neoplasm or dominant metastatic focus. 2. Subtle focal FDG accumulation in the region of the gastrohepatic and hepato duodenal ligaments. No underlying lymphadenopathy is evident, but metastatic disease a concern. Close attention in these regions on follow-up recommended. 3. Hypermetabolic uptake in the right C2 vertebral body and left sacrum. The left sacral uptake is in a region of marked degeneration, but hypermetabolism is higher than typically seen for degenerative change. All bony metastatic involvement is a possibility, it is not considered definite on this study. Attention on follow-up recommended. Electronically Signed   By: Misty Stanley M.D.   On: 10/29/2017 11:28    Labs:  CBC: Recent Labs    11/22/17 1152  WBC 8.6  HGB 12.5  HCT 38.8  PLT 199    COAGS: Recent Labs    11/22/17 1152  INR 0.97  APTT 31    BMP: No results for input(s): NA, K, CL, CO2, GLUCOSE, BUN, CALCIUM, CREATININE, GFRNONAA, GFRAA in the last 8760 hours.  Invalid input(s): CMP  LIVER FUNCTION TESTS: No results for input(s): BILITOT, AST, ALT, ALKPHOS, PROT, ALBUMIN in the last 8760 hours.  TUMOR MARKERS: No results for input(s): AFPTM, CEA, CA199, CHROMGRNA in the last 8760 hours.  Assessment and Plan: Patient with past medical history of HTN presents with complaint of newly identified lung mass with lymphadenopathy.  IR consulted for lymph node biopsy at the request of Dr. Criss Rosales. Case reviewed by Dr. Vernard Gambles who approves patient for procedure.  Patient presents today in their usual state of health.  She has been NPO and is not currently on blood thinners.   Risks  and benefits discussed with the patient including, but not limited to bleeding, infection, damage to adjacent structures or low yield requiring additional tests.  All of the patient's questions were answered, patient is agreeable to proceed. Consent signed and in chart.   Thank you for this interesting consult.  I greatly enjoyed meeting Angie Bruce and look forward to participating in their care.  A copy of this report was sent to the requesting provider on this date.  Electronically Signed: Docia Barrier, PA 11/22/2017, 12:50 PM   I spent a total of  30 Minutes   in face to face in clinical consultation, greater than 50% of which was counseling/coordinating care for lymphadenopathy.

## 2017-11-22 NOTE — Discharge Instructions (Addendum)
Needle Biopsy, Care After Refer to this sheet in the next few weeks. These instructions provide you with information about caring for yourself after your procedure. Your health care provider may also give you more specific instructions. Your treatment has been planned according to current medical practices, but problems sometimes occur. Call your health care provider if you have any problems or questions after your procedure. What can I expect after the procedure? After your procedure, it is common to have soreness, bruising, or mild pain at the biopsy site. This should go away in a few days. Follow these instructions at home:  Rest as directed by your health care provider.  Take medicines only as directed by your health care provider.  There are many different ways to close and cover the biopsy site, including stitches (sutures), skin glue, and adhesive strips. Follow your health care provider's instructions about: ? Biopsy site care. ? Bandage (dressing) changes and removal. ? Biopsy site closure removal.  Check your biopsy site every day for signs of infection. Watch for: ? Redness, swelling, or pain. ? Fluid, blood, or pus. Contact a health care provider if:  You have a fever.  You have redness, swelling, or pain at the biopsy site that lasts longer than a few days.  You have fluid, blood, or pus coming from the biopsy site.  You feel nauseous.  You vomit. Get help right away if:  You have shortness of breath.  You have trouble breathing.  You have chest pain.  You feel dizzy or you faint.  You have bleeding that does not stop with pressure or a bandage.  You cough up blood.  You have pain in your abdomen. This information is not intended to replace advice given to you by your health care provider. Make sure you discuss any questions you have with your health care provider. Document Released: 09/18/2014 Document Revised: 10/10/2015 Document Reviewed:  04/30/2014 Elsevier Interactive Patient Education  2018 [employer]. Moderate Conscious Sedation, Adult, Care After These instructions provide you with information about caring for yourself after your procedure. Your health care provider may also give you more specific instructions. Your treatment has been planned according to current medical practices, but problems sometimes occur. Call your health care provider if you have any problems or questions after your procedure. What can I expect after the procedure? After your procedure, it is common:  To feel sleepy for several hours.  To feel clumsy and have poor balance for several hours.  To have poor judgment for several hours.  To vomit if you eat too soon.  Follow these instructions at home: For at least 24 hours after the procedure:   Do not: ? Participate in activities where you could fall or become injured. ? Drive. ? Use heavy machinery. ? Drink alcohol. ? Take sleeping pills or medicines that cause drowsiness. ? Make important decisions or sign legal documents. ? Take care of children on your own.  Rest. Eating and drinking  Follow the diet recommended by your health care provider.  If you vomit: ? Drink water, juice, or soup when you can drink without vomiting. ? Make sure you have little or no nausea before eating solid foods. General instructions  Have a responsible adult stay with you until you are awake and alert.  Take over-the-counter and prescription medicines only as told by your health care provider.  If you smoke, do not smoke without supervision.  Keep all follow-up visits as told by your health care  provider. This is important. Contact a health care provider if:  You keep feeling nauseous or you keep vomiting.  You feel light-headed.  You develop a rash.  You have a fever. Get help right away if:  You have trouble breathing. This information is not intended to replace advice given to you  by your health care provider. Make sure you discuss any questions you have with your health care provider. Document Released: 02/22/2013 Document Revised: 10/07/2015 Document Reviewed: 08/24/2015 Elsevier Interactive Patient Education  [employer].

## 2017-11-22 NOTE — Procedures (Signed)
Pre Procedure Dx: Left axillary lymph adenopathy Post Procedural Dx: Same  Technically successful US guided biopsy of left axillary lymph node  EBL: None  No immediate complications.   Ronny Bacon, MD Pager #: [phone]

## 2017-12-06 DIAGNOSIS — R918 Other nonspecific abnormal finding of lung field: Secondary | ICD-10-CM | POA: Diagnosis not present

## 2017-12-09 DIAGNOSIS — R591 Generalized enlarged lymph nodes: Secondary | ICD-10-CM | POA: Diagnosis not present

## 2017-12-09 DIAGNOSIS — Z79899 Other long term (current) drug therapy: Secondary | ICD-10-CM | POA: Diagnosis not present

## 2017-12-09 DIAGNOSIS — J449 Chronic obstructive pulmonary disease, unspecified: Secondary | ICD-10-CM | POA: Diagnosis not present

## 2017-12-09 DIAGNOSIS — I889 Nonspecific lymphadenitis, unspecified: Secondary | ICD-10-CM | POA: Diagnosis not present

## 2017-12-09 DIAGNOSIS — Z7722 Contact with and (suspected) exposure to environmental tobacco smoke (acute) (chronic): Secondary | ICD-10-CM | POA: Diagnosis not present

## 2017-12-09 DIAGNOSIS — R590 Localized enlarged lymph nodes: Secondary | ICD-10-CM | POA: Diagnosis not present

## 2017-12-09 DIAGNOSIS — Z7982 Long term (current) use of aspirin: Secondary | ICD-10-CM | POA: Diagnosis not present

## 2017-12-09 DIAGNOSIS — R918 Other nonspecific abnormal finding of lung field: Secondary | ICD-10-CM | POA: Diagnosis not present

## 2017-12-09 DIAGNOSIS — R911 Solitary pulmonary nodule: Secondary | ICD-10-CM | POA: Diagnosis not present

## 2017-12-10 DIAGNOSIS — Z6829 Body mass index (BMI) 29.0-29.9, adult: Secondary | ICD-10-CM | POA: Diagnosis not present

## 2017-12-10 DIAGNOSIS — J399 Disease of upper respiratory tract, unspecified: Secondary | ICD-10-CM | POA: Diagnosis not present

## 2018-01-10 ENCOUNTER — Other Ambulatory Visit: Payer: Self-pay | Admitting: Family Medicine

## 2018-01-10 DIAGNOSIS — J189 Pneumonia, unspecified organism: Secondary | ICD-10-CM | POA: Diagnosis not present

## 2018-01-10 DIAGNOSIS — Z1231 Encounter for screening mammogram for malignant neoplasm of breast: Secondary | ICD-10-CM

## 2018-01-10 DIAGNOSIS — R591 Generalized enlarged lymph nodes: Secondary | ICD-10-CM | POA: Diagnosis not present

## 2018-01-10 DIAGNOSIS — R911 Solitary pulmonary nodule: Secondary | ICD-10-CM | POA: Diagnosis not present

## 2018-01-10 DIAGNOSIS — R918 Other nonspecific abnormal finding of lung field: Secondary | ICD-10-CM | POA: Diagnosis not present

## 2018-01-12 DIAGNOSIS — Z23 Encounter for immunization: Secondary | ICD-10-CM | POA: Diagnosis not present

## 2018-02-15 ENCOUNTER — Other Ambulatory Visit: Payer: Self-pay | Admitting: Otolaryngology

## 2018-02-15 ENCOUNTER — Ambulatory Visit
Admission: RE | Admit: 2018-02-15 | Discharge: 2018-02-15 | Disposition: A | Discharge status: Home / Self Care | Payer: Medicare Other | Source: Ambulatory Visit | Attending: Otolaryngology | Admitting: Otolaryngology

## 2018-02-15 DIAGNOSIS — J320 Chronic maxillary sinusitis: Secondary | ICD-10-CM

## 2018-02-15 DIAGNOSIS — R0981 Nasal congestion: Secondary | ICD-10-CM | POA: Diagnosis not present

## 2018-02-15 DIAGNOSIS — J301 Allergic rhinitis due to pollen: Secondary | ICD-10-CM | POA: Diagnosis not present

## 2018-02-15 DIAGNOSIS — J322 Chronic ethmoidal sinusitis: Secondary | ICD-10-CM | POA: Diagnosis not present

## 2018-02-22 ENCOUNTER — Ambulatory Visit
Admission: RE | Admit: 2018-02-22 | Discharge: 2018-02-22 | Disposition: A | Discharge status: Home / Self Care | Payer: Medicare Other | Source: Ambulatory Visit | Attending: Family Medicine | Admitting: Family Medicine

## 2018-02-22 DIAGNOSIS — Z1231 Encounter for screening mammogram for malignant neoplasm of breast: Secondary | ICD-10-CM

## 2018-04-26 DIAGNOSIS — H40033 Anatomical narrow angle, bilateral: Secondary | ICD-10-CM | POA: Diagnosis not present

## 2018-04-26 DIAGNOSIS — H2513 Age-related nuclear cataract, bilateral: Secondary | ICD-10-CM | POA: Diagnosis not present

## 2018-06-15 DIAGNOSIS — H1045 Other chronic allergic conjunctivitis: Secondary | ICD-10-CM | POA: Diagnosis not present

## 2018-06-15 DIAGNOSIS — J301 Allergic rhinitis due to pollen: Secondary | ICD-10-CM | POA: Diagnosis not present

## 2018-06-15 DIAGNOSIS — J3089 Other allergic rhinitis: Secondary | ICD-10-CM | POA: Diagnosis not present

## 2018-07-20 DIAGNOSIS — Z0000 Encounter for general adult medical examination without abnormal findings: Secondary | ICD-10-CM | POA: Diagnosis not present

## 2018-08-02 DIAGNOSIS — Z124 Encounter for screening for malignant neoplasm of cervix: Secondary | ICD-10-CM | POA: Diagnosis not present

## 2018-08-06 DIAGNOSIS — Z23 Encounter for immunization: Secondary | ICD-10-CM | POA: Diagnosis not present

## 2018-08-31 DIAGNOSIS — M130 Polyarthritis, unspecified: Secondary | ICD-10-CM | POA: Diagnosis not present

## 2018-08-31 DIAGNOSIS — I10 Essential (primary) hypertension: Secondary | ICD-10-CM | POA: Diagnosis not present

## 2018-08-31 DIAGNOSIS — F064 Anxiety disorder due to known physiological condition: Secondary | ICD-10-CM | POA: Diagnosis not present

## 2018-08-31 DIAGNOSIS — Z6829 Body mass index (BMI) 29.0-29.9, adult: Secondary | ICD-10-CM | POA: Diagnosis not present

## 2018-08-31 DIAGNOSIS — M7918 Myalgia, other site: Secondary | ICD-10-CM | POA: Diagnosis not present

## 2018-09-12 DIAGNOSIS — F064 Anxiety disorder due to known physiological condition: Secondary | ICD-10-CM | POA: Diagnosis not present

## 2018-10-05 DIAGNOSIS — F064 Anxiety disorder due to known physiological condition: Secondary | ICD-10-CM | POA: Diagnosis not present

## 2018-10-05 DIAGNOSIS — E785 Hyperlipidemia, unspecified: Secondary | ICD-10-CM | POA: Diagnosis not present

## 2018-10-05 DIAGNOSIS — I10 Essential (primary) hypertension: Secondary | ICD-10-CM | POA: Diagnosis not present

## 2018-10-05 DIAGNOSIS — M180 Bilateral primary osteoarthritis of first carpometacarpal joints: Secondary | ICD-10-CM | POA: Diagnosis not present

## 2018-10-28 DIAGNOSIS — Z20828 Contact with and (suspected) exposure to other viral communicable diseases: Secondary | ICD-10-CM | POA: Diagnosis not present

## 2018-11-01 DIAGNOSIS — H40033 Anatomical narrow angle, bilateral: Secondary | ICD-10-CM | POA: Diagnosis not present

## 2018-11-01 DIAGNOSIS — H2513 Age-related nuclear cataract, bilateral: Secondary | ICD-10-CM | POA: Diagnosis not present

## 2018-12-07 DIAGNOSIS — M797 Fibromyalgia: Secondary | ICD-10-CM | POA: Diagnosis not present

## 2018-12-07 DIAGNOSIS — I10 Essential (primary) hypertension: Secondary | ICD-10-CM | POA: Diagnosis not present

## 2018-12-07 DIAGNOSIS — R7309 Other abnormal glucose: Secondary | ICD-10-CM | POA: Diagnosis not present

## 2018-12-07 DIAGNOSIS — E782 Mixed hyperlipidemia: Secondary | ICD-10-CM | POA: Diagnosis not present

## 2019-01-17 ENCOUNTER — Other Ambulatory Visit: Payer: Self-pay | Admitting: Family Medicine

## 2019-01-17 DIAGNOSIS — Z1231 Encounter for screening mammogram for malignant neoplasm of breast: Secondary | ICD-10-CM

## 2019-01-20 DIAGNOSIS — Z23 Encounter for immunization: Secondary | ICD-10-CM | POA: Diagnosis not present

## 2019-03-03 ENCOUNTER — Other Ambulatory Visit: Payer: Self-pay

## 2019-03-03 ENCOUNTER — Ambulatory Visit
Admission: RE | Admit: 2019-03-03 | Discharge: 2019-03-03 | Disposition: A | Discharge status: Home / Self Care | Payer: Medicare Other | Source: Ambulatory Visit | Attending: Family Medicine | Admitting: Family Medicine

## 2019-03-03 DIAGNOSIS — Z1231 Encounter for screening mammogram for malignant neoplasm of breast: Secondary | ICD-10-CM

## 2019-03-28 DIAGNOSIS — Z20828 Contact with and (suspected) exposure to other viral communicable diseases: Secondary | ICD-10-CM | POA: Diagnosis not present

## 2019-04-04 DIAGNOSIS — I10 Essential (primary) hypertension: Secondary | ICD-10-CM | POA: Diagnosis not present

## 2019-04-04 DIAGNOSIS — M797 Fibromyalgia: Secondary | ICD-10-CM | POA: Diagnosis not present

## 2019-04-04 DIAGNOSIS — R7309 Other abnormal glucose: Secondary | ICD-10-CM | POA: Diagnosis not present

## 2019-04-04 DIAGNOSIS — M130 Polyarthritis, unspecified: Secondary | ICD-10-CM | POA: Diagnosis not present

## 2019-04-06 DIAGNOSIS — H2513 Age-related nuclear cataract, bilateral: Secondary | ICD-10-CM | POA: Diagnosis not present

## 2019-04-06 DIAGNOSIS — H40033 Anatomical narrow angle, bilateral: Secondary | ICD-10-CM | POA: Diagnosis not present

## 2019-05-31 DIAGNOSIS — J3089 Other allergic rhinitis: Secondary | ICD-10-CM | POA: Diagnosis not present

## 2019-05-31 DIAGNOSIS — H1045 Other chronic allergic conjunctivitis: Secondary | ICD-10-CM | POA: Diagnosis not present

## 2019-05-31 DIAGNOSIS — J301 Allergic rhinitis due to pollen: Secondary | ICD-10-CM | POA: Diagnosis not present

## 2019-08-03 DIAGNOSIS — R7309 Other abnormal glucose: Secondary | ICD-10-CM | POA: Diagnosis not present

## 2019-08-03 DIAGNOSIS — E785 Hyperlipidemia, unspecified: Secondary | ICD-10-CM | POA: Diagnosis not present

## 2019-08-03 DIAGNOSIS — I10 Essential (primary) hypertension: Secondary | ICD-10-CM | POA: Diagnosis not present

## 2019-08-03 DIAGNOSIS — R799 Abnormal finding of blood chemistry, unspecified: Secondary | ICD-10-CM | POA: Diagnosis not present

## 2019-08-03 DIAGNOSIS — E782 Mixed hyperlipidemia: Secondary | ICD-10-CM | POA: Diagnosis not present

## 2019-08-08 DIAGNOSIS — M130 Polyarthritis, unspecified: Secondary | ICD-10-CM | POA: Diagnosis not present

## 2019-08-08 DIAGNOSIS — I10 Essential (primary) hypertension: Secondary | ICD-10-CM | POA: Diagnosis not present

## 2019-08-08 DIAGNOSIS — F064 Anxiety disorder due to known physiological condition: Secondary | ICD-10-CM | POA: Diagnosis not present

## 2019-08-10 ENCOUNTER — Other Ambulatory Visit (HOSPITAL_COMMUNITY): Payer: Self-pay | Admitting: Family Medicine

## 2019-08-10 DIAGNOSIS — M79605 Pain in left leg: Secondary | ICD-10-CM

## 2019-08-10 DIAGNOSIS — M7989 Other specified soft tissue disorders: Secondary | ICD-10-CM

## 2019-08-11 ENCOUNTER — Other Ambulatory Visit: Payer: Self-pay

## 2019-08-11 ENCOUNTER — Ambulatory Visit (HOSPITAL_COMMUNITY)
Admission: RE | Admit: 2019-08-11 | Discharge: 2019-08-11 | Disposition: A | Discharge status: Home / Self Care | Payer: Medicare Other | Source: Ambulatory Visit | Attending: Family Medicine | Admitting: Family Medicine

## 2019-08-11 DIAGNOSIS — M7989 Other specified soft tissue disorders: Secondary | ICD-10-CM | POA: Diagnosis not present

## 2019-08-11 DIAGNOSIS — M79605 Pain in left leg: Secondary | ICD-10-CM | POA: Diagnosis not present

## 2019-08-11 DIAGNOSIS — M79604 Pain in right leg: Secondary | ICD-10-CM

## 2019-08-11 NOTE — Progress Notes (Signed)
Right lower extremity venous duplex has been completed. Preliminary results can be found in CV Proc through chart review.  Results were given to [facility] at Dr. Fransico Setters office.  08/11/19 11:03 AM Carlos Levering RVT

## 2019-09-08 DIAGNOSIS — I10 Essential (primary) hypertension: Secondary | ICD-10-CM | POA: Diagnosis not present

## 2019-09-12 DIAGNOSIS — Z124 Encounter for screening for malignant neoplasm of cervix: Secondary | ICD-10-CM | POA: Diagnosis not present

## 2019-09-12 DIAGNOSIS — Z01419 Encounter for gynecological examination (general) (routine) without abnormal findings: Secondary | ICD-10-CM | POA: Diagnosis not present

## 2019-09-22 DIAGNOSIS — R21 Rash and other nonspecific skin eruption: Secondary | ICD-10-CM | POA: Diagnosis not present

## 2019-09-22 DIAGNOSIS — I872 Venous insufficiency (chronic) (peripheral): Secondary | ICD-10-CM | POA: Diagnosis not present

## 2019-09-22 DIAGNOSIS — I10 Essential (primary) hypertension: Secondary | ICD-10-CM | POA: Diagnosis not present

## 2019-09-29 DIAGNOSIS — I82411 Acute embolism and thrombosis of right femoral vein: Secondary | ICD-10-CM | POA: Diagnosis not present

## 2019-09-29 DIAGNOSIS — M25561 Pain in right knee: Secondary | ICD-10-CM | POA: Diagnosis not present

## 2019-09-29 DIAGNOSIS — R21 Rash and other nonspecific skin eruption: Secondary | ICD-10-CM | POA: Diagnosis not present

## 2019-09-29 DIAGNOSIS — Z7722 Contact with and (suspected) exposure to environmental tobacco smoke (acute) (chronic): Secondary | ICD-10-CM | POA: Diagnosis not present

## 2019-09-29 DIAGNOSIS — Z7901 Long term (current) use of anticoagulants: Secondary | ICD-10-CM | POA: Diagnosis not present

## 2019-09-29 DIAGNOSIS — M79661 Pain in right lower leg: Secondary | ICD-10-CM | POA: Diagnosis not present

## 2019-09-29 DIAGNOSIS — R600 Localized edema: Secondary | ICD-10-CM | POA: Diagnosis not present

## 2019-10-04 DIAGNOSIS — H40033 Anatomical narrow angle, bilateral: Secondary | ICD-10-CM | POA: Diagnosis not present

## 2019-10-04 DIAGNOSIS — H2513 Age-related nuclear cataract, bilateral: Secondary | ICD-10-CM | POA: Diagnosis not present

## 2019-10-05 DIAGNOSIS — R21 Rash and other nonspecific skin eruption: Secondary | ICD-10-CM | POA: Diagnosis not present

## 2019-10-05 DIAGNOSIS — I872 Venous insufficiency (chronic) (peripheral): Secondary | ICD-10-CM | POA: Diagnosis not present

## 2019-10-24 DIAGNOSIS — H2513 Age-related nuclear cataract, bilateral: Secondary | ICD-10-CM | POA: Diagnosis not present

## 2019-10-24 DIAGNOSIS — H40033 Anatomical narrow angle, bilateral: Secondary | ICD-10-CM | POA: Diagnosis not present

## 2019-10-26 DIAGNOSIS — I10 Essential (primary) hypertension: Secondary | ICD-10-CM | POA: Diagnosis not present

## 2019-10-26 DIAGNOSIS — E785 Hyperlipidemia, unspecified: Secondary | ICD-10-CM | POA: Diagnosis not present

## 2019-10-26 DIAGNOSIS — R21 Rash and other nonspecific skin eruption: Secondary | ICD-10-CM | POA: Diagnosis not present

## 2019-10-26 DIAGNOSIS — I872 Venous insufficiency (chronic) (peripheral): Secondary | ICD-10-CM | POA: Diagnosis not present

## 2019-11-03 DIAGNOSIS — H2513 Age-related nuclear cataract, bilateral: Secondary | ICD-10-CM | POA: Diagnosis not present

## 2019-11-03 DIAGNOSIS — H40033 Anatomical narrow angle, bilateral: Secondary | ICD-10-CM | POA: Diagnosis not present

## 2019-11-03 DIAGNOSIS — H35033 Hypertensive retinopathy, bilateral: Secondary | ICD-10-CM | POA: Diagnosis not present

## 2019-12-07 DIAGNOSIS — R21 Rash and other nonspecific skin eruption: Secondary | ICD-10-CM | POA: Diagnosis not present

## 2019-12-12 ENCOUNTER — Other Ambulatory Visit: Payer: Self-pay | Admitting: Family Medicine

## 2019-12-12 ENCOUNTER — Ambulatory Visit
Admission: RE | Admit: 2019-12-12 | Discharge: 2019-12-12 | Disposition: A | Discharge status: Home / Self Care | Payer: Medicare Other | Source: Ambulatory Visit | Attending: Family Medicine | Admitting: Family Medicine

## 2019-12-12 ENCOUNTER — Other Ambulatory Visit: Payer: Self-pay

## 2019-12-12 DIAGNOSIS — M797 Fibromyalgia: Secondary | ICD-10-CM | POA: Diagnosis not present

## 2019-12-12 DIAGNOSIS — M47812 Spondylosis without myelopathy or radiculopathy, cervical region: Secondary | ICD-10-CM | POA: Diagnosis not present

## 2019-12-12 DIAGNOSIS — M546 Pain in thoracic spine: Secondary | ICD-10-CM

## 2019-12-12 DIAGNOSIS — I10 Essential (primary) hypertension: Secondary | ICD-10-CM | POA: Diagnosis not present

## 2019-12-12 DIAGNOSIS — M810 Age-related osteoporosis without current pathological fracture: Secondary | ICD-10-CM | POA: Diagnosis not present

## 2019-12-12 DIAGNOSIS — M130 Polyarthritis, unspecified: Secondary | ICD-10-CM | POA: Diagnosis not present

## 2019-12-14 DIAGNOSIS — H2513 Age-related nuclear cataract, bilateral: Secondary | ICD-10-CM | POA: Diagnosis not present

## 2019-12-14 DIAGNOSIS — H35033 Hypertensive retinopathy, bilateral: Secondary | ICD-10-CM | POA: Diagnosis not present

## 2019-12-14 DIAGNOSIS — H40033 Anatomical narrow angle, bilateral: Secondary | ICD-10-CM | POA: Diagnosis not present

## 2019-12-26 DIAGNOSIS — M79604 Pain in right leg: Secondary | ICD-10-CM | POA: Diagnosis not present

## 2019-12-26 DIAGNOSIS — M546 Pain in thoracic spine: Secondary | ICD-10-CM | POA: Diagnosis not present

## 2019-12-26 DIAGNOSIS — M545 Low back pain: Secondary | ICD-10-CM | POA: Diagnosis not present

## 2019-12-27 ENCOUNTER — Other Ambulatory Visit: Payer: Self-pay

## 2019-12-27 DIAGNOSIS — M7989 Other specified soft tissue disorders: Secondary | ICD-10-CM

## 2019-12-27 DIAGNOSIS — M79605 Pain in left leg: Secondary | ICD-10-CM

## 2019-12-28 DIAGNOSIS — I10 Essential (primary) hypertension: Secondary | ICD-10-CM | POA: Diagnosis not present

## 2019-12-28 DIAGNOSIS — M810 Age-related osteoporosis without current pathological fracture: Secondary | ICD-10-CM | POA: Diagnosis not present

## 2019-12-28 DIAGNOSIS — R7309 Other abnormal glucose: Secondary | ICD-10-CM | POA: Diagnosis not present

## 2019-12-28 DIAGNOSIS — M797 Fibromyalgia: Secondary | ICD-10-CM | POA: Diagnosis not present

## 2020-01-02 DIAGNOSIS — M79604 Pain in right leg: Secondary | ICD-10-CM | POA: Diagnosis not present

## 2020-01-02 DIAGNOSIS — M546 Pain in thoracic spine: Secondary | ICD-10-CM | POA: Diagnosis not present

## 2020-01-02 DIAGNOSIS — M545 Low back pain: Secondary | ICD-10-CM | POA: Diagnosis not present

## 2020-01-08 DIAGNOSIS — K2100 Gastro-esophageal reflux disease with esophagitis, without bleeding: Secondary | ICD-10-CM | POA: Diagnosis not present

## 2020-01-08 DIAGNOSIS — Z800 Family history of malignant neoplasm of digestive organs: Secondary | ICD-10-CM | POA: Diagnosis not present

## 2020-01-08 DIAGNOSIS — Z86010 Personal history of colonic polyps: Secondary | ICD-10-CM | POA: Diagnosis not present

## 2020-01-09 DIAGNOSIS — M545 Low back pain: Secondary | ICD-10-CM | POA: Diagnosis not present

## 2020-01-09 DIAGNOSIS — M546 Pain in thoracic spine: Secondary | ICD-10-CM | POA: Diagnosis not present

## 2020-01-09 DIAGNOSIS — M79604 Pain in right leg: Secondary | ICD-10-CM | POA: Diagnosis not present

## 2020-01-11 DIAGNOSIS — M79604 Pain in right leg: Secondary | ICD-10-CM | POA: Diagnosis not present

## 2020-01-11 DIAGNOSIS — M546 Pain in thoracic spine: Secondary | ICD-10-CM | POA: Diagnosis not present

## 2020-01-11 DIAGNOSIS — M545 Low back pain: Secondary | ICD-10-CM | POA: Diagnosis not present

## 2020-01-12 ENCOUNTER — Encounter: Payer: Self-pay | Admitting: Vascular Surgery

## 2020-01-12 ENCOUNTER — Ambulatory Visit (INDEPENDENT_AMBULATORY_CARE_PROVIDER_SITE_OTHER): Payer: Medicare Other | Admitting: Vascular Surgery

## 2020-01-12 ENCOUNTER — Other Ambulatory Visit: Payer: Self-pay

## 2020-01-12 ENCOUNTER — Ambulatory Visit (HOSPITAL_COMMUNITY)
Admission: RE | Admit: 2020-01-12 | Discharge: 2020-01-12 | Disposition: A | Discharge status: Home / Self Care | Payer: Medicare Other | Source: Ambulatory Visit | Attending: Vascular Surgery | Admitting: Vascular Surgery

## 2020-01-12 VITALS — BP 135/81 | HR 53 | Temp 97.3°F | Resp 20 | Ht 66.0 in | Wt 180.0 lb

## 2020-01-12 DIAGNOSIS — I82411 Acute embolism and thrombosis of right femoral vein: Secondary | ICD-10-CM | POA: Diagnosis not present

## 2020-01-12 DIAGNOSIS — M79605 Pain in left leg: Secondary | ICD-10-CM | POA: Diagnosis not present

## 2020-01-12 DIAGNOSIS — M7989 Other specified soft tissue disorders: Secondary | ICD-10-CM | POA: Diagnosis not present

## 2020-01-12 NOTE — Progress Notes (Signed)
Patient ID: [name], female   DOB: Jul 06, 1945, 74 y.o.   MRN: [id number]  Reason for Consult: New Patient (Initial Visit)   Referred by Lucianne Lei, MD  Subjective:     HPI:  Angie Bruce is a 74 y.o. female history of DVT.  She now follows up for evaluation of right lower extremity pain and has venous reflux study today.  She initially had a DVT study in March for swelling this was negative.  She subsequently went to [facility] in May was found to have right femoral vein DVT in the distal femoral vein.  She was placed on Eliquis for a period of 6 months due to and in November.  She is not taking any other antiplatelet or anticoagulants at this time.  She has no previous personal or family history of DVT.  She does have extensive family history of cancer does not have any personal history of cancer.  She is up-to-date on her colonoscopies every 5 years and also her mammograms.  She is set to get another colonoscopy when she is finished with her Eliquis.  She does have some healed ulcers on her legs which she states were from working in the [employer] when she was younger.  She does not have any active ulceration.  Past Medical History:  Diagnosis Date  . Allergy   . DVT (deep venous thrombosis) ([place])   . Hypertension    Family History  Problem Relation Age of Onset  . Stomach cancer Mother   . Stroke Father    History reviewed. No pertinent surgical history.  Short Social History:  Social History   Tobacco Use  . Smoking status: Never Smoker  . Smokeless tobacco: Never Used  Substance Use Topics  . Alcohol use: No    Allergies  Allergen Reactions  . Diclofenac Sodium     Caused redness to skin  . Sulfa Antibiotics Swelling    Current Outpatient Medications  Medication Sig Dispense Refill  . acetaminophen (TYLENOL) 325 MG tablet Take 650 mg by mouth every 6 (six) hours as needed.    Marland Kitchen apixaban (ELIQUIS) 5 MG TABS tablet Take by mouth.    Marland Kitchen azelastine  (ASTELIN) 0.1 % nasal spray Place 1 spray into both nostrils 2 (two) times daily.     . clotrimazole-betamethasone (LOTRISONE) cream Apply 1 application topically 2 (two) times daily.    Marland Kitchen levocetirizine (XYZAL) 5 MG tablet Take 5 mg by mouth at bedtime.     . montelukast (SINGULAIR) 10 MG tablet Take 10 mg by mouth at bedtime.    . triamcinolone cream (KENALOG) 0.1 % Apply 1 application topically 2 (two) times daily.    Marland Kitchen triamterene-hydrochlorothiazide (MAXZIDE-25) 37.5-25 MG per tablet Take 1 tablet by mouth daily.     No current facility-administered medications for this visit.    Review of Systems  Constitutional:  Constitutional negative. HENT: HENT negative.  Eyes: Eyes negative.  Respiratory: Respiratory negative.  Cardiovascular: Positive for leg swelling.  GI: Gastrointestinal negative.  Skin: Skin negative.       Well-healed wounds Neurological: Neurological negative. Hematologic: Hematologic/lymphatic negative.  Psychiatric: Psychiatric negative.        Objective:  Objective   Vitals:   01/12/20 1000  BP: 135/81  Pulse: (!) 53  Resp: 20  Temp: (!) 97.3 F (36.3 C)  SpO2: 98%    Physical Exam HENT:     Head: Normocephalic.     Nose:     Comments:  Wearing a mask Eyes:     Extraocular Movements: Extraocular movements intact.     Pupils: Pupils are equal, round, and reactive to light.  Cardiovascular:     Pulses: Normal pulses.  Pulmonary:     Effort: Pulmonary effort is normal.     Breath sounds: Normal breath sounds.  Abdominal:     General: Abdomen is flat.     Palpations: Abdomen is soft. There is no mass.  Musculoskeletal:     Cervical back: Normal range of motion.  Skin:    General: Skin is warm and dry.     Capillary Refill: Capillary refill takes less than 2 seconds.     Comments: Mild darkening of the skin at the ankles, well-healed previous wounds bilateral lateral legs  Neurological:     General: No focal deficit present.     Mental  Status: She is alert.  Psychiatric:        Mood and Affect: Mood normal.        Behavior: Behavior normal.        Thought Content: Thought content normal.        Judgment: Judgment normal.     Data: I have independently interpreted her lower extremity venous reflux study on the right.  She does have reflux in the greater saphenous vein from the saphenofemoral junction down to the knee.  Greatest diameter 0.44 cm at the junction past that is only 0.33 cm.  There are multiple branches in the calf.     Assessment/Plan:     73 year old female follows up after femoral DVT really does not have much swelling in the right lower extremity does have some pain.  She does have some discoloration of her legs no reflux on the right lower extremity.  She is wearing knee-high compression stockings.  I discussed with her the risk of further DVT when she comes off of Eliquis which I do think would be safe in November.  I discussed transitioning to baby aspirin daily and wearing compression stockings as needed.  She should be good for any activity.  She will also get colonoscopy and continue with mammography to decrease her risk of cancer which would also decrease her risk of future DVT.  She can follow-up with me on an as-needed basis.     Angie Sandy MD [facility]

## 2020-01-16 DIAGNOSIS — H40033 Anatomical narrow angle, bilateral: Secondary | ICD-10-CM | POA: Diagnosis not present

## 2020-01-16 DIAGNOSIS — H35033 Hypertensive retinopathy, bilateral: Secondary | ICD-10-CM | POA: Diagnosis not present

## 2020-01-16 DIAGNOSIS — M79604 Pain in right leg: Secondary | ICD-10-CM | POA: Diagnosis not present

## 2020-01-16 DIAGNOSIS — M545 Low back pain: Secondary | ICD-10-CM | POA: Diagnosis not present

## 2020-01-16 DIAGNOSIS — H2513 Age-related nuclear cataract, bilateral: Secondary | ICD-10-CM | POA: Diagnosis not present

## 2020-01-16 DIAGNOSIS — M546 Pain in thoracic spine: Secondary | ICD-10-CM | POA: Diagnosis not present

## 2020-01-18 DIAGNOSIS — M545 Low back pain: Secondary | ICD-10-CM | POA: Diagnosis not present

## 2020-01-18 DIAGNOSIS — M546 Pain in thoracic spine: Secondary | ICD-10-CM | POA: Diagnosis not present

## 2020-01-18 DIAGNOSIS — M79604 Pain in right leg: Secondary | ICD-10-CM | POA: Diagnosis not present

## 2020-01-19 ENCOUNTER — Other Ambulatory Visit: Payer: Self-pay | Admitting: Family Medicine

## 2020-01-19 DIAGNOSIS — Z1231 Encounter for screening mammogram for malignant neoplasm of breast: Secondary | ICD-10-CM

## 2020-01-23 DIAGNOSIS — M546 Pain in thoracic spine: Secondary | ICD-10-CM | POA: Diagnosis not present

## 2020-01-23 DIAGNOSIS — M545 Low back pain: Secondary | ICD-10-CM | POA: Diagnosis not present

## 2020-01-23 DIAGNOSIS — M79604 Pain in right leg: Secondary | ICD-10-CM | POA: Diagnosis not present

## 2020-01-25 DIAGNOSIS — M546 Pain in thoracic spine: Secondary | ICD-10-CM | POA: Diagnosis not present

## 2020-01-25 DIAGNOSIS — M545 Low back pain: Secondary | ICD-10-CM | POA: Diagnosis not present

## 2020-01-25 DIAGNOSIS — M79604 Pain in right leg: Secondary | ICD-10-CM | POA: Diagnosis not present

## 2020-02-26 DIAGNOSIS — Z23 Encounter for immunization: Secondary | ICD-10-CM | POA: Diagnosis not present

## 2020-02-27 DIAGNOSIS — I10 Essential (primary) hypertension: Secondary | ICD-10-CM | POA: Diagnosis not present

## 2020-02-27 DIAGNOSIS — E782 Mixed hyperlipidemia: Secondary | ICD-10-CM | POA: Diagnosis not present

## 2020-02-27 DIAGNOSIS — R21 Rash and other nonspecific skin eruption: Secondary | ICD-10-CM | POA: Diagnosis not present

## 2020-03-04 ENCOUNTER — Ambulatory Visit: Payer: Medicare Other

## 2020-03-14 ENCOUNTER — Telehealth: Payer: Self-pay

## 2020-03-14 NOTE — Telephone Encounter (Signed)
Patient called requesting clearance for a colonoscopy. Per Cain's note and PA approval - faxed GI with approval to stop taking Eliquis in November by transitioning to baby ASA and wearing compression stockings also with clearance from a vascular standpoint to have procedure.

## 2020-03-19 ENCOUNTER — Telehealth: Payer: Self-pay

## 2020-03-19 NOTE — Telephone Encounter (Signed)
Patient called to ask whether she had been cleared for her colonoscopy. Told her I had sent the paperwork on 03/14/20 and that if the GI office did not receive it, they would need to follow up. Patient verbalized understanding.

## 2020-04-09 ENCOUNTER — Other Ambulatory Visit: Payer: Self-pay

## 2020-04-09 ENCOUNTER — Ambulatory Visit
Admission: RE | Admit: 2020-04-09 | Discharge: 2020-04-09 | Disposition: A | Discharge status: Home / Self Care | Payer: Medicare Other | Source: Ambulatory Visit | Attending: Family Medicine | Admitting: Family Medicine

## 2020-04-09 DIAGNOSIS — Z1231 Encounter for screening mammogram for malignant neoplasm of breast: Secondary | ICD-10-CM

## 2020-04-23 DIAGNOSIS — H40033 Anatomical narrow angle, bilateral: Secondary | ICD-10-CM | POA: Diagnosis not present

## 2020-04-23 DIAGNOSIS — H2513 Age-related nuclear cataract, bilateral: Secondary | ICD-10-CM | POA: Diagnosis not present

## 2020-04-23 DIAGNOSIS — H35033 Hypertensive retinopathy, bilateral: Secondary | ICD-10-CM | POA: Diagnosis not present

## 2020-04-30 DIAGNOSIS — F064 Anxiety disorder due to known physiological condition: Secondary | ICD-10-CM | POA: Diagnosis not present

## 2020-04-30 DIAGNOSIS — R7309 Other abnormal glucose: Secondary | ICD-10-CM | POA: Diagnosis not present

## 2020-04-30 DIAGNOSIS — I8310 Varicose veins of unspecified lower extremity with inflammation: Secondary | ICD-10-CM | POA: Diagnosis not present

## 2020-04-30 DIAGNOSIS — E785 Hyperlipidemia, unspecified: Secondary | ICD-10-CM | POA: Diagnosis not present

## 2020-05-29 DIAGNOSIS — Z01812 Encounter for preprocedural laboratory examination: Secondary | ICD-10-CM | POA: Diagnosis not present

## 2020-05-31 DIAGNOSIS — H1045 Other chronic allergic conjunctivitis: Secondary | ICD-10-CM | POA: Diagnosis not present

## 2020-05-31 DIAGNOSIS — J301 Allergic rhinitis due to pollen: Secondary | ICD-10-CM | POA: Diagnosis not present

## 2020-05-31 DIAGNOSIS — J3089 Other allergic rhinitis: Secondary | ICD-10-CM | POA: Diagnosis not present

## 2020-07-05 DIAGNOSIS — Z01812 Encounter for preprocedural laboratory examination: Secondary | ICD-10-CM | POA: Diagnosis not present

## 2020-07-09 DIAGNOSIS — Z800 Family history of malignant neoplasm of digestive organs: Secondary | ICD-10-CM | POA: Diagnosis not present

## 2020-07-09 DIAGNOSIS — D123 Benign neoplasm of transverse colon: Secondary | ICD-10-CM | POA: Diagnosis not present

## 2020-07-09 DIAGNOSIS — Z86010 Personal history of colonic polyps: Secondary | ICD-10-CM | POA: Diagnosis not present

## 2020-07-09 DIAGNOSIS — Z8371 Family history of colonic polyps: Secondary | ICD-10-CM | POA: Diagnosis not present

## 2020-07-12 DIAGNOSIS — D123 Benign neoplasm of transverse colon: Secondary | ICD-10-CM | POA: Diagnosis not present

## 2020-07-30 DIAGNOSIS — H00011 Hordeolum externum right upper eyelid: Secondary | ICD-10-CM | POA: Diagnosis not present

## 2020-08-06 DIAGNOSIS — Z0000 Encounter for general adult medical examination without abnormal findings: Secondary | ICD-10-CM | POA: Diagnosis not present

## 2020-08-06 DIAGNOSIS — I8310 Varicose veins of unspecified lower extremity with inflammation: Secondary | ICD-10-CM | POA: Diagnosis not present

## 2020-08-06 DIAGNOSIS — I10 Essential (primary) hypertension: Secondary | ICD-10-CM | POA: Diagnosis not present

## 2020-08-06 DIAGNOSIS — E785 Hyperlipidemia, unspecified: Secondary | ICD-10-CM | POA: Diagnosis not present

## 2020-09-05 DIAGNOSIS — H00011 Hordeolum externum right upper eyelid: Secondary | ICD-10-CM | POA: Diagnosis not present

## 2020-09-05 DIAGNOSIS — H2513 Age-related nuclear cataract, bilateral: Secondary | ICD-10-CM | POA: Diagnosis not present

## 2020-09-30 DIAGNOSIS — Z20822 Contact with and (suspected) exposure to covid-19: Secondary | ICD-10-CM | POA: Diagnosis not present

## 2020-10-01 DIAGNOSIS — R7309 Other abnormal glucose: Secondary | ICD-10-CM | POA: Diagnosis not present

## 2020-10-01 DIAGNOSIS — I10 Essential (primary) hypertension: Secondary | ICD-10-CM | POA: Diagnosis not present

## 2020-10-01 DIAGNOSIS — E785 Hyperlipidemia, unspecified: Secondary | ICD-10-CM | POA: Diagnosis not present

## 2020-10-01 DIAGNOSIS — I872 Venous insufficiency (chronic) (peripheral): Secondary | ICD-10-CM | POA: Diagnosis not present

## 2020-11-26 DIAGNOSIS — H2513 Age-related nuclear cataract, bilateral: Secondary | ICD-10-CM | POA: Diagnosis not present

## 2020-12-12 ENCOUNTER — Ambulatory Visit (INDEPENDENT_AMBULATORY_CARE_PROVIDER_SITE_OTHER): Payer: Medicare Other | Admitting: Otolaryngology

## 2020-12-12 ENCOUNTER — Other Ambulatory Visit: Payer: Self-pay

## 2020-12-12 DIAGNOSIS — J310 Chronic rhinitis: Secondary | ICD-10-CM

## 2020-12-12 NOTE — Progress Notes (Signed)
HPI: Angie Bruce is a 75 y.o. female who presents for evaluation of oral and sinus complaints.  Several years ago apparently she has some problems with her dentition and gums in her mouth and was seen by Dr. Ernesto Rutherford who treated with doxycycline for couple weeks.  This eventually got better. Today she is complaining some trouble flossing between her teeth and she saw her dentist who stated that everything looked good.  She is not describing any pain or discomfort.  She has had several implants in the past.  She has some nasal congestion but mostly just clear mucus discharge. She has seen Dr. Donneta Romberg for allergies and is on a nasal spray by Dr. Donneta Romberg but she believes azelastine.  Past Medical History:  Diagnosis Date   Allergy    DVT (deep venous thrombosis) ([place])    Hypertension    No past surgical history on file. Social History   Socioeconomic History   Marital status: Married    Spouse name: Not on file   Number of children: Not on file   Years of education: Not on file   Highest education level: Not on file  Occupational History   Not on file  Tobacco Use   Smoking status: Never   Smokeless tobacco: Never  Vaping Use   Vaping Use: Never used  Substance and Sexual Activity   Alcohol use: No   Drug use: No   Sexual activity: Not on file  Other Topics Concern   Not on file  Social History Narrative   Not on file   Social Determinants of Health   Financial Resource Strain: Not on file  Food Insecurity: Not on file  Transportation Needs: Not on file  Physical Activity: Not on file  Stress: Not on file  Social Connections: Not on file   Family History  Problem Relation Age of Onset   Stomach cancer Mother    Stroke Father    Allergies  Allergen Reactions   Diclofenac Sodium     Caused redness to skin   Sulfa Antibiotics Swelling   Prior to Admission medications   Medication Sig Start Date End Date Taking? Authorizing Provider  acetaminophen (TYLENOL) 325  MG tablet Take 650 mg by mouth every 6 (six) hours as needed.    [provider]  apixaban (ELIQUIS) 5 MG TABS tablet Take by mouth. 09/29/19   [provider]  azelastine (ASTELIN) 0.1 % nasal spray Place 1 spray into both nostrils 2 (two) times daily.     [provider]  clotrimazole-betamethasone (LOTRISONE) cream Apply 1 application topically 2 (two) times daily.    [provider]  levocetirizine (XYZAL) 5 MG tablet Take 5 mg by mouth at bedtime.     [provider]  montelukast (SINGULAIR) 10 MG tablet Take 10 mg by mouth at bedtime.    [provider]  triamcinolone cream (KENALOG) 0.1 % Apply 1 application topically 2 (two) times daily.    [provider]  triamterene-hydrochlorothiazide (MAXZIDE-25) 37.5-25 MG per tablet Take 1 tablet by mouth daily.    [provider]     Positive ROS: Otherwise negative  All other systems have been reviewed and were otherwise negative with the exception of those mentioned in the HPI and as above.  Physical Exam: Constitutional: Alert, well-appearing, no acute distress Ears: External ears without lesions or tenderness. Ear canals are clear bilaterally with intact, clear TMs.  Nasal: External nose without lesions. Septum with mild deformity and moderate  rhinitis.  After decongesting the nose both middle meatus regions were clear.  No evidence of mucopurulent discharge.  No polyps noted.. Oral: Lips and gums without lesions. Tongue and palate mucosa without lesions. Posterior oropharynx clear.  Dentition appears clear throughout with no alveolar mucosal disease.  No signs of infection and no leukoplakia or erythema. Neck: No palpable adenopathy or masses Respiratory: Breathing comfortably  Skin: No facial/neck lesions or rash noted.  Procedures  Assessment: Chronic rhinitis.  Clinically no evidence of active sinus infection.  Plan: Recommend use of the nasal steroid spray  for drainage and congestion as needed. Reassured her of no clinical signs of infection presently requiring antibiotic therapy. Reassured her of normal oral examination with no signs of infection or neoplasia. She will follow-up as needed.  Radene Journey, MD

## 2021-01-07 DIAGNOSIS — E785 Hyperlipidemia, unspecified: Secondary | ICD-10-CM | POA: Diagnosis not present

## 2021-01-07 DIAGNOSIS — E1169 Type 2 diabetes mellitus with other specified complication: Secondary | ICD-10-CM | POA: Diagnosis not present

## 2021-01-07 DIAGNOSIS — I10 Essential (primary) hypertension: Secondary | ICD-10-CM | POA: Diagnosis not present

## 2021-03-11 ENCOUNTER — Other Ambulatory Visit: Payer: Self-pay | Admitting: Family Medicine

## 2021-03-11 DIAGNOSIS — Z1231 Encounter for screening mammogram for malignant neoplasm of breast: Secondary | ICD-10-CM

## 2021-04-15 ENCOUNTER — Other Ambulatory Visit: Payer: Self-pay

## 2021-04-15 ENCOUNTER — Ambulatory Visit
Admission: RE | Admit: 2021-04-15 | Discharge: 2021-04-15 | Disposition: A | Discharge status: Home / Self Care | Payer: Medicare Other | Source: Ambulatory Visit | Attending: Family Medicine | Admitting: Family Medicine

## 2021-04-15 DIAGNOSIS — Z1231 Encounter for screening mammogram for malignant neoplasm of breast: Secondary | ICD-10-CM | POA: Diagnosis not present

## 2021-04-29 DIAGNOSIS — I10 Essential (primary) hypertension: Secondary | ICD-10-CM | POA: Diagnosis not present

## 2021-04-29 DIAGNOSIS — R7309 Other abnormal glucose: Secondary | ICD-10-CM | POA: Diagnosis not present

## 2021-04-29 DIAGNOSIS — E785 Hyperlipidemia, unspecified: Secondary | ICD-10-CM | POA: Diagnosis not present

## 2021-04-29 DIAGNOSIS — E1169 Type 2 diabetes mellitus with other specified complication: Secondary | ICD-10-CM | POA: Diagnosis not present

## 2021-05-06 DIAGNOSIS — E1169 Type 2 diabetes mellitus with other specified complication: Secondary | ICD-10-CM | POA: Diagnosis not present

## 2021-05-06 DIAGNOSIS — I10 Essential (primary) hypertension: Secondary | ICD-10-CM | POA: Diagnosis not present

## 2021-05-26 DIAGNOSIS — H1045 Other chronic allergic conjunctivitis: Secondary | ICD-10-CM | POA: Diagnosis not present

## 2021-05-26 DIAGNOSIS — J3089 Other allergic rhinitis: Secondary | ICD-10-CM | POA: Diagnosis not present

## 2021-05-26 DIAGNOSIS — J301 Allergic rhinitis due to pollen: Secondary | ICD-10-CM | POA: Diagnosis not present

## 2021-06-03 DIAGNOSIS — H5203 Hypermetropia, bilateral: Secondary | ICD-10-CM | POA: Diagnosis not present

## 2021-06-03 DIAGNOSIS — H2513 Age-related nuclear cataract, bilateral: Secondary | ICD-10-CM | POA: Diagnosis not present

## 2021-07-17 DIAGNOSIS — H2513 Age-related nuclear cataract, bilateral: Secondary | ICD-10-CM | POA: Diagnosis not present

## 2021-07-17 DIAGNOSIS — H402230 Chronic angle-closure glaucoma, bilateral, stage unspecified: Secondary | ICD-10-CM | POA: Diagnosis not present

## 2021-09-30 DIAGNOSIS — E1169 Type 2 diabetes mellitus with other specified complication: Secondary | ICD-10-CM | POA: Diagnosis not present

## 2021-09-30 DIAGNOSIS — E785 Hyperlipidemia, unspecified: Secondary | ICD-10-CM | POA: Diagnosis not present

## 2021-09-30 DIAGNOSIS — M130 Polyarthritis, unspecified: Secondary | ICD-10-CM | POA: Diagnosis not present

## 2021-09-30 DIAGNOSIS — I10 Essential (primary) hypertension: Secondary | ICD-10-CM | POA: Diagnosis not present

## 2021-09-30 DIAGNOSIS — R5383 Other fatigue: Secondary | ICD-10-CM | POA: Diagnosis not present

## 2021-09-30 DIAGNOSIS — Z6828 Body mass index (BMI) 28.0-28.9, adult: Secondary | ICD-10-CM | POA: Diagnosis not present

## 2021-09-30 DIAGNOSIS — Z0000 Encounter for general adult medical examination without abnormal findings: Secondary | ICD-10-CM | POA: Diagnosis not present

## 2021-10-30 DIAGNOSIS — H2513 Age-related nuclear cataract, bilateral: Secondary | ICD-10-CM | POA: Diagnosis not present

## 2021-10-30 DIAGNOSIS — H402230 Chronic angle-closure glaucoma, bilateral, stage unspecified: Secondary | ICD-10-CM | POA: Diagnosis not present

## 2021-12-15 DIAGNOSIS — H2511 Age-related nuclear cataract, right eye: Secondary | ICD-10-CM | POA: Diagnosis not present

## 2021-12-15 DIAGNOSIS — H2513 Age-related nuclear cataract, bilateral: Secondary | ICD-10-CM | POA: Diagnosis not present

## 2021-12-15 DIAGNOSIS — H269 Unspecified cataract: Secondary | ICD-10-CM | POA: Diagnosis not present

## 2021-12-19 DIAGNOSIS — U071 COVID-19: Secondary | ICD-10-CM | POA: Diagnosis not present

## 2021-12-31 DIAGNOSIS — D235 Other benign neoplasm of skin of trunk: Secondary | ICD-10-CM | POA: Diagnosis not present

## 2021-12-31 DIAGNOSIS — L438 Other lichen planus: Secondary | ICD-10-CM | POA: Diagnosis not present

## 2021-12-31 DIAGNOSIS — I8312 Varicose veins of left lower extremity with inflammation: Secondary | ICD-10-CM | POA: Diagnosis not present

## 2021-12-31 DIAGNOSIS — I8311 Varicose veins of right lower extremity with inflammation: Secondary | ICD-10-CM | POA: Diagnosis not present

## 2022-01-06 DIAGNOSIS — M797 Fibromyalgia: Secondary | ICD-10-CM | POA: Diagnosis not present

## 2022-01-06 DIAGNOSIS — I10 Essential (primary) hypertension: Secondary | ICD-10-CM | POA: Diagnosis not present

## 2022-01-06 DIAGNOSIS — E782 Mixed hyperlipidemia: Secondary | ICD-10-CM | POA: Diagnosis not present

## 2022-01-06 DIAGNOSIS — R7309 Other abnormal glucose: Secondary | ICD-10-CM | POA: Diagnosis not present

## 2022-01-14 DIAGNOSIS — Z800 Family history of malignant neoplasm of digestive organs: Secondary | ICD-10-CM | POA: Diagnosis not present

## 2022-01-14 DIAGNOSIS — K921 Melena: Secondary | ICD-10-CM | POA: Diagnosis not present

## 2022-01-14 DIAGNOSIS — Z86010 Personal history of colonic polyps: Secondary | ICD-10-CM | POA: Diagnosis not present

## 2022-02-20 ENCOUNTER — Other Ambulatory Visit: Payer: Self-pay | Admitting: Family Medicine

## 2022-02-20 DIAGNOSIS — Z1231 Encounter for screening mammogram for malignant neoplasm of breast: Secondary | ICD-10-CM

## 2022-04-06 DIAGNOSIS — J069 Acute upper respiratory infection, unspecified: Secondary | ICD-10-CM | POA: Diagnosis not present

## 2022-04-06 DIAGNOSIS — I10 Essential (primary) hypertension: Secondary | ICD-10-CM | POA: Diagnosis not present

## 2022-04-06 DIAGNOSIS — J399 Disease of upper respiratory tract, unspecified: Secondary | ICD-10-CM | POA: Diagnosis not present

## 2022-04-16 ENCOUNTER — Ambulatory Visit
Admission: RE | Admit: 2022-04-16 | Discharge: 2022-04-16 | Disposition: A | Discharge status: Home / Self Care | Payer: Medicare Other | Source: Ambulatory Visit | Attending: Family Medicine | Admitting: Family Medicine

## 2022-04-16 DIAGNOSIS — Z1231 Encounter for screening mammogram for malignant neoplasm of breast: Secondary | ICD-10-CM | POA: Diagnosis not present

## 2022-04-20 ENCOUNTER — Other Ambulatory Visit: Payer: Self-pay | Admitting: Family Medicine

## 2022-04-20 DIAGNOSIS — R928 Other abnormal and inconclusive findings on diagnostic imaging of breast: Secondary | ICD-10-CM

## 2022-05-05 ENCOUNTER — Ambulatory Visit
Admission: RE | Admit: 2022-05-05 | Discharge: 2022-05-05 | Disposition: A | Discharge status: Home / Self Care | Payer: Medicare Other | Source: Ambulatory Visit | Attending: Family Medicine | Admitting: Family Medicine

## 2022-05-05 ENCOUNTER — Other Ambulatory Visit: Payer: Self-pay | Admitting: Family Medicine

## 2022-05-05 DIAGNOSIS — N6313 Unspecified lump in the right breast, lower outer quadrant: Secondary | ICD-10-CM | POA: Diagnosis not present

## 2022-05-05 DIAGNOSIS — I10 Essential (primary) hypertension: Secondary | ICD-10-CM | POA: Diagnosis not present

## 2022-05-05 DIAGNOSIS — R928 Other abnormal and inconclusive findings on diagnostic imaging of breast: Secondary | ICD-10-CM

## 2022-05-05 DIAGNOSIS — N6310 Unspecified lump in the right breast, unspecified quadrant: Secondary | ICD-10-CM

## 2022-05-05 DIAGNOSIS — E782 Mixed hyperlipidemia: Secondary | ICD-10-CM | POA: Diagnosis not present

## 2022-05-05 DIAGNOSIS — J069 Acute upper respiratory infection, unspecified: Secondary | ICD-10-CM | POA: Diagnosis not present

## 2022-05-05 DIAGNOSIS — N6314 Unspecified lump in the right breast, lower inner quadrant: Secondary | ICD-10-CM | POA: Diagnosis not present

## 2022-05-05 DIAGNOSIS — R7309 Other abnormal glucose: Secondary | ICD-10-CM | POA: Diagnosis not present

## 2022-05-08 DIAGNOSIS — I10 Essential (primary) hypertension: Secondary | ICD-10-CM | POA: Diagnosis not present

## 2022-05-08 DIAGNOSIS — M797 Fibromyalgia: Secondary | ICD-10-CM | POA: Diagnosis not present

## 2022-05-19 ENCOUNTER — Encounter: Payer: Self-pay | Admitting: Family Medicine

## 2022-05-19 ENCOUNTER — Ambulatory Visit
Admission: RE | Admit: 2022-05-19 | Discharge: 2022-05-19 | Disposition: A | Discharge status: Home / Self Care | Payer: Medicare Other | Source: Ambulatory Visit | Attending: Family Medicine | Admitting: Family Medicine

## 2022-05-19 DIAGNOSIS — N6001 Solitary cyst of right breast: Secondary | ICD-10-CM | POA: Diagnosis not present

## 2022-05-19 DIAGNOSIS — N6310 Unspecified lump in the right breast, unspecified quadrant: Secondary | ICD-10-CM

## 2022-05-28 DIAGNOSIS — H402234 Chronic angle-closure glaucoma, bilateral, indeterminate stage: Secondary | ICD-10-CM | POA: Diagnosis not present

## 2022-05-28 DIAGNOSIS — H2512 Age-related nuclear cataract, left eye: Secondary | ICD-10-CM | POA: Diagnosis not present

## 2022-06-08 DIAGNOSIS — H1045 Other chronic allergic conjunctivitis: Secondary | ICD-10-CM | POA: Diagnosis not present

## 2022-06-08 DIAGNOSIS — J301 Allergic rhinitis due to pollen: Secondary | ICD-10-CM | POA: Diagnosis not present

## 2022-06-08 DIAGNOSIS — J3089 Other allergic rhinitis: Secondary | ICD-10-CM | POA: Diagnosis not present

## 2022-06-23 ENCOUNTER — Ambulatory Visit (INDEPENDENT_AMBULATORY_CARE_PROVIDER_SITE_OTHER): Payer: Medicare Other | Admitting: Podiatry

## 2022-06-23 ENCOUNTER — Encounter: Payer: Self-pay | Admitting: Podiatry

## 2022-06-23 DIAGNOSIS — L600 Ingrowing nail: Secondary | ICD-10-CM | POA: Diagnosis not present

## 2022-06-23 NOTE — Patient Instructions (Signed)

## 2022-06-23 NOTE — Progress Notes (Signed)
  Subjective:  Patient ID: Angie Bruce, female    DOB: November 16, 1945,   MRN: [id number]  Chief Complaint  Patient presents with   Nail Problem    LEFT FOOT 3RD TOE INGROWN TOENAIL,     77 y.o. female presents for concern of ingrown left third toe that has been bothering her for a while. She has tried to trim back but has still been painful for her. Wants to discuss options . Denies any other pedal complaints. Denies n/v/f/c.   Past Medical History:  Diagnosis Date   Allergy    DVT (deep venous thrombosis) ([facility])    Hypertension     Objective:  Physical Exam: Vascular: DP/PT pulses 2/4 bilateral. CFT <3 seconds. Normal hair growth on digits. No edema.  Skin. No lacerations or abrasions bilateral feet. Lateral third digit with incurvation. Tender to palpation. No erythema edema or purulence noted.  Musculoskeletal: MMT 5/5 bilateral lower extremities in DF, PF, Inversion and Eversion. Deceased ROM in DF of ankle joint.  Neurological: Sensation intact to light touch.   Assessment:   1. Ingrown toenail      Plan:  Patient was evaluated and treated and all questions answered. Patient requesting removal of ingrown nail today. Procedure below.  Discussed procedure and post procedure care and patient expressed understanding.  Will follow-up in 2 weeks for nail check or sooner if any problems arise.    Procedure:  Procedure: partial Nail Avulsion of left lateral third digit nail border.  Surgeon: Lorenda Peck, DPM  Pre-op Dx: Ingrown toenail without infection Post-op: Same  Place of Surgery: Office exam room.  Indications for surgery: Painful and ingrown toenail.    The patient is requesting removal of nail with chemical matrixectomy. Risks and complications were discussed with the patient for which they understand and written consent was obtained. Under sterile conditions a total of 3 mL of  1% lidocaine plain was infiltrated in a hallux block fashion. Once anesthetized, the  skin was prepped in sterile fashion. A tourniquet was then applied. Next the lateral  aspect of left third digit nail border was then sharply excised making sure to remove the entire offending nail border.  Next phenol was then applied under standard conditions and copiously irrigated. Silvadene was applied. A dry sterile dressing was applied. After application of the dressing the tourniquet was removed and there is found to be an immediate capillary refill time to the digit. The patient tolerated the procedure well without any complications. Post procedure instructions were discussed the patient for which he verbally understood. Follow-up in two weeks for nail check or sooner if any problems are to arise. Discussed signs/symptoms of infection and directed to call the office immediately should any occur or go directly to the emergency room. In the meantime, encouraged to call the office with any questions, concerns, changes symptoms.   Lorenda Peck, DPM

## 2022-07-07 ENCOUNTER — Ambulatory Visit (INDEPENDENT_AMBULATORY_CARE_PROVIDER_SITE_OTHER): Payer: Medicare Other | Admitting: Podiatry

## 2022-07-07 ENCOUNTER — Encounter: Payer: Self-pay | Admitting: Podiatry

## 2022-07-07 DIAGNOSIS — L600 Ingrowing nail: Secondary | ICD-10-CM

## 2022-07-07 NOTE — Progress Notes (Signed)
  Subjective:  Patient ID: Angie Bruce, female    DOB: [birth year],   MRN: [id number]  Chief Complaint  Patient presents with   Nail Problem     2 wk nail f/u patient states nail is much better since the last visit. Patient states she is still soaking her toenails     77 y.o. female presents for follow up of left third digit nail procedure. Relates doing much better and has still been soaking . Denies any other pedal complaints. Denies n/v/f/c.   Past Medical History:  Diagnosis Date   Allergy    DVT (deep venous thrombosis) ([facility])    Hypertension     Objective:  Physical Exam: Vascular: DP/PT pulses 2/4 bilateral. CFT <3 seconds. Normal hair growth on digits. No edema.  Skin. No lacerations or abrasions bilateral feet. Left lateral third digit nail healing well.  Musculoskeletal: MMT 5/5 bilateral lower extremities in DF, PF, Inversion and Eversion. Deceased ROM in DF of ankle joint.  Neurological: Sensation intact to light touch.   Assessment:  No diagnosis found.   Plan:  Patient was evaluated and treated and all questions answered. Toe was evaluated and appears to be healing well.  May discontinue soaks and neosporin.  Patient to follow-up as needed.    Lorenda Peck, DPM

## 2022-07-08 DIAGNOSIS — L738 Other specified follicular disorders: Secondary | ICD-10-CM | POA: Diagnosis not present

## 2022-07-08 DIAGNOSIS — Z79899 Other long term (current) drug therapy: Secondary | ICD-10-CM | POA: Diagnosis not present

## 2022-07-08 DIAGNOSIS — L438 Other lichen planus: Secondary | ICD-10-CM | POA: Diagnosis not present

## 2022-07-08 DIAGNOSIS — L720 Epidermal cyst: Secondary | ICD-10-CM | POA: Diagnosis not present

## 2022-07-08 DIAGNOSIS — D485 Neoplasm of uncertain behavior of skin: Secondary | ICD-10-CM | POA: Diagnosis not present

## 2022-07-08 DIAGNOSIS — D235 Other benign neoplasm of skin of trunk: Secondary | ICD-10-CM | POA: Diagnosis not present

## 2022-07-23 DIAGNOSIS — Z4802 Encounter for removal of sutures: Secondary | ICD-10-CM | POA: Diagnosis not present

## 2022-07-29 ENCOUNTER — Telehealth: Payer: Self-pay

## 2022-07-29 ENCOUNTER — Ambulatory Visit (HOSPITAL_COMMUNITY)
Admission: RE | Admit: 2022-07-29 | Discharge: 2022-07-29 | Disposition: A | Discharge status: Home / Self Care | Payer: Medicare Other | Source: Ambulatory Visit | Attending: Vascular Surgery | Admitting: Vascular Surgery

## 2022-07-29 ENCOUNTER — Other Ambulatory Visit: Payer: Self-pay

## 2022-07-29 DIAGNOSIS — M7989 Other specified soft tissue disorders: Secondary | ICD-10-CM

## 2022-07-29 DIAGNOSIS — Z86718 Personal history of other venous thrombosis and embolism: Secondary | ICD-10-CM | POA: Diagnosis not present

## 2022-07-29 NOTE — Telephone Encounter (Signed)
Pt is aware she had a negative DVT study and will f/u with her PCP regarding the swelling. She will call us back if anything changes/worsens or she wants to be seen here. No further questions/concerns at this time.

## 2022-07-29 NOTE — Telephone Encounter (Signed)
Pt called with c/o RLE swelling to the right of her calf for about 2 weeks. She can feel it but does not feel it is painful or red. She has history of RLE DVT and is concerned about this being another DVT. Pt has been scheduled for an u/s today.

## 2022-07-30 DIAGNOSIS — E782 Mixed hyperlipidemia: Secondary | ICD-10-CM | POA: Diagnosis not present

## 2022-07-30 DIAGNOSIS — M79604 Pain in right leg: Secondary | ICD-10-CM | POA: Diagnosis not present

## 2022-07-30 DIAGNOSIS — F064 Anxiety disorder due to known physiological condition: Secondary | ICD-10-CM | POA: Diagnosis not present

## 2022-07-30 DIAGNOSIS — E1169 Type 2 diabetes mellitus with other specified complication: Secondary | ICD-10-CM | POA: Diagnosis not present

## 2022-08-21 DIAGNOSIS — M79604 Pain in right leg: Secondary | ICD-10-CM | POA: Diagnosis not present

## 2022-08-21 DIAGNOSIS — E1169 Type 2 diabetes mellitus with other specified complication: Secondary | ICD-10-CM | POA: Diagnosis not present

## 2022-08-21 DIAGNOSIS — I10 Essential (primary) hypertension: Secondary | ICD-10-CM | POA: Diagnosis not present

## 2022-08-21 DIAGNOSIS — E785 Hyperlipidemia, unspecified: Secondary | ICD-10-CM | POA: Diagnosis not present

## 2022-10-23 DIAGNOSIS — M797 Fibromyalgia: Secondary | ICD-10-CM | POA: Diagnosis not present

## 2022-10-23 DIAGNOSIS — M130 Polyarthritis, unspecified: Secondary | ICD-10-CM | POA: Diagnosis not present

## 2022-10-23 DIAGNOSIS — R7309 Other abnormal glucose: Secondary | ICD-10-CM | POA: Diagnosis not present

## 2022-10-23 DIAGNOSIS — I872 Venous insufficiency (chronic) (peripheral): Secondary | ICD-10-CM | POA: Diagnosis not present

## 2022-10-23 DIAGNOSIS — Z6828 Body mass index (BMI) 28.0-28.9, adult: Secondary | ICD-10-CM | POA: Diagnosis not present

## 2022-10-23 DIAGNOSIS — I10 Essential (primary) hypertension: Secondary | ICD-10-CM | POA: Diagnosis not present

## 2022-10-23 DIAGNOSIS — Z0001 Encounter for general adult medical examination with abnormal findings: Secondary | ICD-10-CM | POA: Diagnosis not present

## 2022-10-23 DIAGNOSIS — E559 Vitamin D deficiency, unspecified: Secondary | ICD-10-CM | POA: Diagnosis not present

## 2022-10-23 DIAGNOSIS — E785 Hyperlipidemia, unspecified: Secondary | ICD-10-CM | POA: Diagnosis not present

## 2022-10-23 DIAGNOSIS — E1169 Type 2 diabetes mellitus with other specified complication: Secondary | ICD-10-CM | POA: Diagnosis not present

## 2022-11-17 DIAGNOSIS — H2512 Age-related nuclear cataract, left eye: Secondary | ICD-10-CM | POA: Diagnosis not present

## 2023-01-12 ENCOUNTER — Other Ambulatory Visit: Payer: Self-pay | Admitting: Family Medicine

## 2023-01-12 DIAGNOSIS — Z0000 Encounter for general adult medical examination without abnormal findings: Secondary | ICD-10-CM

## 2023-01-12 DIAGNOSIS — Z8262 Family history of osteoporosis: Secondary | ICD-10-CM | POA: Diagnosis not present

## 2023-02-08 DIAGNOSIS — L720 Epidermal cyst: Secondary | ICD-10-CM | POA: Diagnosis not present

## 2023-02-08 DIAGNOSIS — M793 Panniculitis, unspecified: Secondary | ICD-10-CM | POA: Diagnosis not present

## 2023-02-08 DIAGNOSIS — L819 Disorder of pigmentation, unspecified: Secondary | ICD-10-CM | POA: Diagnosis not present

## 2023-02-19 DIAGNOSIS — R21 Rash and other nonspecific skin eruption: Secondary | ICD-10-CM | POA: Diagnosis not present

## 2023-02-22 DIAGNOSIS — R7309 Other abnormal glucose: Secondary | ICD-10-CM | POA: Diagnosis not present

## 2023-02-22 DIAGNOSIS — I872 Venous insufficiency (chronic) (peripheral): Secondary | ICD-10-CM | POA: Diagnosis not present

## 2023-02-22 DIAGNOSIS — I10 Essential (primary) hypertension: Secondary | ICD-10-CM | POA: Diagnosis not present

## 2023-02-22 DIAGNOSIS — M130 Polyarthritis, unspecified: Secondary | ICD-10-CM | POA: Diagnosis not present

## 2023-02-22 DIAGNOSIS — E785 Hyperlipidemia, unspecified: Secondary | ICD-10-CM | POA: Diagnosis not present

## 2023-02-23 DIAGNOSIS — I10 Essential (primary) hypertension: Secondary | ICD-10-CM | POA: Diagnosis not present

## 2023-02-23 DIAGNOSIS — E785 Hyperlipidemia, unspecified: Secondary | ICD-10-CM | POA: Diagnosis not present

## 2023-02-23 DIAGNOSIS — R7309 Other abnormal glucose: Secondary | ICD-10-CM | POA: Diagnosis not present

## 2023-04-19 DIAGNOSIS — L720 Epidermal cyst: Secondary | ICD-10-CM | POA: Diagnosis not present

## 2023-05-20 DIAGNOSIS — H2512 Age-related nuclear cataract, left eye: Secondary | ICD-10-CM | POA: Diagnosis not present

## 2023-06-02 ENCOUNTER — Ambulatory Visit
Admission: RE | Admit: 2023-06-02 | Discharge: 2023-06-02 | Disposition: A | Discharge status: Home / Self Care | Payer: Medicare Other | Source: Ambulatory Visit | Attending: Family Medicine | Admitting: Family Medicine

## 2023-06-02 DIAGNOSIS — Z0000 Encounter for general adult medical examination without abnormal findings: Secondary | ICD-10-CM

## 2023-06-02 DIAGNOSIS — Z1231 Encounter for screening mammogram for malignant neoplasm of breast: Secondary | ICD-10-CM | POA: Diagnosis not present

## 2023-06-24 DIAGNOSIS — R635 Abnormal weight gain: Secondary | ICD-10-CM | POA: Diagnosis not present

## 2023-06-24 DIAGNOSIS — E1169 Type 2 diabetes mellitus with other specified complication: Secondary | ICD-10-CM | POA: Diagnosis not present

## 2023-06-24 DIAGNOSIS — E782 Mixed hyperlipidemia: Secondary | ICD-10-CM | POA: Diagnosis not present

## 2023-06-24 DIAGNOSIS — I10 Essential (primary) hypertension: Secondary | ICD-10-CM | POA: Diagnosis not present

## 2023-07-13 DIAGNOSIS — J3089 Other allergic rhinitis: Secondary | ICD-10-CM | POA: Diagnosis not present

## 2023-07-13 DIAGNOSIS — H1045 Other chronic allergic conjunctivitis: Secondary | ICD-10-CM | POA: Diagnosis not present

## 2023-07-13 DIAGNOSIS — J301 Allergic rhinitis due to pollen: Secondary | ICD-10-CM | POA: Diagnosis not present

## 2023-08-05 DIAGNOSIS — M940 Chondrocostal junction syndrome [Tietze]: Secondary | ICD-10-CM | POA: Diagnosis not present

## 2023-08-05 DIAGNOSIS — I10 Essential (primary) hypertension: Secondary | ICD-10-CM | POA: Diagnosis not present

## 2023-08-27 DIAGNOSIS — U071 COVID-19: Secondary | ICD-10-CM | POA: Diagnosis not present

## 2023-09-08 DIAGNOSIS — L299 Pruritus, unspecified: Secondary | ICD-10-CM | POA: Diagnosis not present

## 2023-09-08 DIAGNOSIS — L819 Disorder of pigmentation, unspecified: Secondary | ICD-10-CM | POA: Diagnosis not present

## 2023-09-16 DIAGNOSIS — E785 Hyperlipidemia, unspecified: Secondary | ICD-10-CM | POA: Diagnosis not present

## 2023-09-16 DIAGNOSIS — Z6832 Body mass index (BMI) 32.0-32.9, adult: Secondary | ICD-10-CM | POA: Diagnosis not present

## 2023-09-16 DIAGNOSIS — R7309 Other abnormal glucose: Secondary | ICD-10-CM | POA: Diagnosis not present

## 2023-09-16 DIAGNOSIS — I10 Essential (primary) hypertension: Secondary | ICD-10-CM | POA: Diagnosis not present

## 2023-09-16 DIAGNOSIS — R293 Abnormal posture: Secondary | ICD-10-CM | POA: Diagnosis not present

## 2023-09-16 DIAGNOSIS — J301 Allergic rhinitis due to pollen: Secondary | ICD-10-CM | POA: Diagnosis not present

## 2023-10-21 DIAGNOSIS — H2512 Age-related nuclear cataract, left eye: Secondary | ICD-10-CM | POA: Diagnosis not present

## 2023-10-21 DIAGNOSIS — H43813 Vitreous degeneration, bilateral: Secondary | ICD-10-CM | POA: Diagnosis not present

## 2023-10-21 DIAGNOSIS — H04123 Dry eye syndrome of bilateral lacrimal glands: Secondary | ICD-10-CM | POA: Diagnosis not present

## 2024-01-18 DIAGNOSIS — E1165 Type 2 diabetes mellitus with hyperglycemia: Secondary | ICD-10-CM | POA: Diagnosis not present

## 2024-01-18 DIAGNOSIS — I10 Essential (primary) hypertension: Secondary | ICD-10-CM | POA: Diagnosis not present

## 2024-01-18 DIAGNOSIS — E785 Hyperlipidemia, unspecified: Secondary | ICD-10-CM | POA: Diagnosis not present

## 2024-01-18 DIAGNOSIS — E1169 Type 2 diabetes mellitus with other specified complication: Secondary | ICD-10-CM | POA: Diagnosis not present

## 2024-01-18 DIAGNOSIS — Z6831 Body mass index (BMI) 31.0-31.9, adult: Secondary | ICD-10-CM | POA: Diagnosis not present

## 2024-03-22 DIAGNOSIS — L299 Pruritus, unspecified: Secondary | ICD-10-CM | POA: Diagnosis not present

## 2024-03-22 DIAGNOSIS — L819 Disorder of pigmentation, unspecified: Secondary | ICD-10-CM | POA: Diagnosis not present

## 2024-04-10 ENCOUNTER — Encounter: Payer: Self-pay | Admitting: Podiatry

## 2024-04-10 ENCOUNTER — Ambulatory Visit (INDEPENDENT_AMBULATORY_CARE_PROVIDER_SITE_OTHER): Admitting: Podiatry

## 2024-04-10 DIAGNOSIS — L600 Ingrowing nail: Secondary | ICD-10-CM

## 2024-04-10 DIAGNOSIS — B351 Tinea unguium: Secondary | ICD-10-CM | POA: Diagnosis not present

## 2024-04-10 MED ORDER — IBUPROFEN 600 MG PO TABS: 600.0000 mg | ORAL_TABLET | Freq: Three times a day (TID) | ORAL | 0 refills | Status: AC | PRN

## 2024-04-10 NOTE — Progress Notes (Signed)
 Subjective:  Patient ID: Angie Bruce, female    DOB: Jan 28, 1946,   MRN: [id number]  Chief Complaint  Patient presents with   Nail Problem    I have an ingrown toenail that bothers me, sometime is worse than others.  It's the toe next to my small toe on my left foot.  The toenail is kind of dark on the side.    78 y.o. female presents for concern of ingrown left third toe that has been bothering her for a while it has returned. . She has tried to trim back but has still been painful for her. Wants to discuss options . Denies any other pedal complaints. Denies n/v/f/c.   Past Medical History:  Diagnosis Date   Allergy    DVT (deep venous thrombosis) ([facility])    Hypertension     Objective:  Physical Exam: Vascular: DP/PT pulses 2/4 bilateral. CFT <3 seconds. Normal hair growth on digits. No edema.  Skin. No lacerations or abrasions bilateral feet. Lateral third digit with incurvation. Tender to palpation. No erythema edema or purulence noted. Noted darkening of this portion of nail with some discoloration noted to proximal nail fold as well not noted previously.  Musculoskeletal: MMT 5/5 bilateral lower extremities in DF, PF, Inversion and Eversion. Deceased ROM in DF of ankle joint.  Neurological: Sensation intact to light touch.   Assessment:   1. Ingrown toenail      Plan:  Patient was evaluated and treated and all questions answered. Patient requesting removal of ingrown nail today. Procedure below.  Discussed procedure and post procedure care and patient expressed understanding.  Will follow-up in 2 weeks for nail check or sooner if any problems arise.   Concern for darkening on lateral border will send to lab for further evaluation to rule out melanoma.  -Clinical picture and Fungal culture was obtained by removing a portion of the hard nail itself from each of the involved toenails using a sterile nail nipper and sent to [facility] lab. Patient tolerated the biopsy  procedure well without discomfort or need for anesthesia.     Procedure:  Procedure: partial Nail Avulsion of left lateral third digit nail border.  Surgeon: Asberry Failing, DPM  Pre-op Dx: Ingrown toenail without infection Post-op: Same  Place of Surgery: Office exam room.  Indications for surgery: Painful and ingrown toenail.    The patient is requesting removal of nail with chemical matrixectomy. Risks and complications were discussed with the patient for which they understand and written consent was obtained. Under sterile conditions a total of 3 mL of  1% lidocaine  plain was infiltrated in a hallux block fashion. Once anesthetized, the skin was prepped in sterile fashion. A tourniquet was then applied. Next the lateral  aspect of left third digit nail border was then sharply excised making sure to remove the entire offending nail border.  Next phenol was then applied under standard conditions and copiously irrigated. Silvadene was applied. A dry sterile dressing was applied. After application of the dressing the tourniquet was removed and there is found to be an immediate capillary refill time to the digit. The patient tolerated the procedure well without any complications. Post procedure instructions were discussed the patient for which he verbally understood. Follow-up in two weeks for nail check or sooner if any problems are to arise. Discussed signs/symptoms of infection and directed to call the office immediately should any occur or go directly to the emergency room. In the meantime, encouraged to call the office  with any questions, concerns, changes symptoms.   Asberry Failing, DPM

## 2024-04-10 NOTE — Addendum Note (Signed)
 Addended by: Sylvana Bonk R on: 04/10/2024 11:46 AM   Modules accepted: Orders

## 2024-04-10 NOTE — Addendum Note (Signed)
 Addended by: WAYLAN ELIDIA PARAS on: 04/10/2024 01:38 PM   Modules accepted: Orders

## 2024-04-10 NOTE — Patient Instructions (Signed)

## 2024-05-05 ENCOUNTER — Other Ambulatory Visit: Payer: Self-pay | Admitting: Family Medicine

## 2024-05-05 DIAGNOSIS — Z1231 Encounter for screening mammogram for malignant neoplasm of breast: Secondary | ICD-10-CM

## 2024-05-08 ENCOUNTER — Encounter: Payer: Self-pay | Admitting: Podiatry

## 2024-05-08 ENCOUNTER — Ambulatory Visit: Admitting: Podiatry

## 2024-05-08 DIAGNOSIS — L600 Ingrowing nail: Secondary | ICD-10-CM

## 2024-05-08 NOTE — Progress Notes (Signed)
"  °  Subjective:  Patient ID: Angie Bruce, female    DOB: Jan 06, 1946,   MRN: [id number]  Chief Complaint  Patient presents with   Nail Problem    It's doing pretty good.  I soaked it last night.  I want to see if the one next to it is okay.    78 y.o. female presents for follow-up of ingrown nail procedure on left third toe.  Here to review culture results as well.. Denies any other pedal complaints. Denies n/v/f/c.   Past Medical History:  Diagnosis Date   Allergy    DVT (deep venous thrombosis) ([facility])    Hypertension     Objective:  Physical Exam: Vascular: DP/PT pulses 2/4 bilateral. CFT <3 seconds. Normal hair growth on digits. No edema.  Skin. No lacerations or abrasions bilateral feet.  Left third digit nail healing well mildly tender to palpation. Musculoskeletal: MMT 5/5 bilateral lower extremities in DF, PF, Inversion and Eversion. Deceased ROM in DF of ankle joint.  Neurological: Sensation intact to light touch.   Assessment:   1. Ingrown toenail       Plan:  Patient was evaluated and treated and all questions answered. Toe was evaluated and appears to be healing well.  May discontinue soaks and neosporin.  Culture negative for pigment or neoplastic lesion.  There was some dermatophytes present.  Discussed likely have cut all of this out and no need for reoccurring treatment at this point. Patient to follow-up as needed.       Asberry Failing, DPM    "

## 2024-06-08 ENCOUNTER — Ambulatory Visit
Admission: RE | Admit: 2024-06-08 | Discharge: 2024-06-08 | Disposition: A | Discharge status: Home / Self Care | Source: Ambulatory Visit | Attending: Family Medicine | Admitting: Family Medicine

## 2024-06-08 DIAGNOSIS — Z1231 Encounter for screening mammogram for malignant neoplasm of breast: Secondary | ICD-10-CM
